# Patient Record
Sex: Female | Born: 1935 | Race: White | Hispanic: No | Smoking: Former smoker
Health system: Southern US, Community
[De-identification: ages and names within clinical notes are randomized; demographics above are authoritative.]

## PROBLEM LIST (undated history)

## (undated) DIAGNOSIS — E559 Vitamin D deficiency, unspecified: Secondary | ICD-10-CM

## (undated) DIAGNOSIS — M549 Dorsalgia, unspecified: Secondary | ICD-10-CM

## (undated) DIAGNOSIS — M48061 Spinal stenosis, lumbar region without neurogenic claudication: Secondary | ICD-10-CM

## (undated) DIAGNOSIS — G8929 Other chronic pain: Secondary | ICD-10-CM

## (undated) DIAGNOSIS — F419 Anxiety disorder, unspecified: Secondary | ICD-10-CM

## (undated) DIAGNOSIS — C50919 Malignant neoplasm of unspecified site of unspecified female breast: Secondary | ICD-10-CM

## (undated) DIAGNOSIS — H9190 Unspecified hearing loss, unspecified ear: Secondary | ICD-10-CM

## (undated) DIAGNOSIS — I10 Essential (primary) hypertension: Secondary | ICD-10-CM

## (undated) HISTORY — DX: Dorsalgia, unspecified: M54.9

## (undated) HISTORY — DX: Malignant neoplasm of unspecified site of unspecified female breast: C50.919

## (undated) HISTORY — DX: Anxiety disorder, unspecified: F41.9

## (undated) HISTORY — DX: Vitamin D deficiency, unspecified: E55.9

## (undated) HISTORY — DX: Essential (primary) hypertension: I10

## (undated) HISTORY — DX: Spinal stenosis, lumbar region without neurogenic claudication: M48.061

## (undated) HISTORY — DX: Other chronic pain: G89.29

## (undated) HISTORY — DX: Unspecified hearing loss, unspecified ear: H91.90

---

## 1985-05-12 DIAGNOSIS — C50919 Malignant neoplasm of unspecified site of unspecified female breast: Secondary | ICD-10-CM

## 1985-05-12 HISTORY — DX: Malignant neoplasm of unspecified site of unspecified female breast: C50.919

## 1985-05-12 HISTORY — PX: MASTECTOMY: SHX3

## 2004-03-07 ENCOUNTER — Encounter: Admission: RE | Admit: 2004-03-07 | Discharge: 2004-03-07 | Payer: Self-pay | Admitting: Orthopedic Surgery

## 2004-03-28 ENCOUNTER — Encounter: Admission: RE | Admit: 2004-03-28 | Discharge: 2004-03-28 | Payer: Self-pay | Admitting: Orthopedic Surgery

## 2004-04-11 ENCOUNTER — Encounter: Admission: RE | Admit: 2004-04-11 | Discharge: 2004-04-11 | Payer: Self-pay | Admitting: Orthopedic Surgery

## 2004-06-05 ENCOUNTER — Encounter: Admission: RE | Admit: 2004-06-05 | Discharge: 2004-06-05 | Payer: Self-pay | Admitting: Orthopedic Surgery

## 2004-07-29 ENCOUNTER — Ambulatory Visit: Payer: Self-pay | Admitting: Family Medicine

## 2004-08-16 ENCOUNTER — Ambulatory Visit: Payer: Self-pay | Admitting: Family Medicine

## 2004-08-26 ENCOUNTER — Encounter: Admission: RE | Admit: 2004-08-26 | Discharge: 2004-09-04 | Payer: Self-pay | Admitting: Family Medicine

## 2004-08-30 ENCOUNTER — Ambulatory Visit: Payer: Self-pay | Admitting: Family Medicine

## 2004-09-17 ENCOUNTER — Ambulatory Visit: Payer: Self-pay | Admitting: Family Medicine

## 2004-10-01 ENCOUNTER — Ambulatory Visit: Payer: Self-pay | Admitting: Family Medicine

## 2004-10-28 ENCOUNTER — Ambulatory Visit: Payer: Self-pay | Admitting: Family Medicine

## 2004-11-14 ENCOUNTER — Ambulatory Visit: Payer: Self-pay | Admitting: Family Medicine

## 2004-12-18 ENCOUNTER — Ambulatory Visit: Payer: Self-pay | Admitting: Family Medicine

## 2004-12-26 ENCOUNTER — Ambulatory Visit: Payer: Self-pay | Admitting: Family Medicine

## 2005-01-21 ENCOUNTER — Other Ambulatory Visit: Admission: RE | Admit: 2005-01-21 | Discharge: 2005-01-21 | Payer: Self-pay | Admitting: Family Medicine

## 2006-06-08 ENCOUNTER — Ambulatory Visit (HOSPITAL_COMMUNITY): Admission: RE | Admit: 2006-06-08 | Discharge: 2006-06-08 | Payer: Self-pay | Admitting: Internal Medicine

## 2010-06-01 ENCOUNTER — Encounter: Payer: Self-pay | Admitting: Orthopedic Surgery

## 2010-06-02 ENCOUNTER — Encounter (HOSPITAL_COMMUNITY): Payer: Self-pay | Admitting: Internal Medicine

## 2012-07-23 ENCOUNTER — Other Ambulatory Visit: Payer: Self-pay | Admitting: *Deleted

## 2012-07-23 DIAGNOSIS — M899 Disorder of bone, unspecified: Secondary | ICD-10-CM

## 2012-07-30 ENCOUNTER — Other Ambulatory Visit: Payer: Self-pay | Admitting: Physician Assistant

## 2012-07-30 NOTE — Telephone Encounter (Signed)
She was calling to check on her mother's prescription. The patient thought that we had called her but she has a slight hearing problem so she could be confused. Patient's daughter state's that a refill request was sent over on Monday but they have not heard anything yet.

## 2012-08-02 ENCOUNTER — Other Ambulatory Visit: Payer: Self-pay | Admitting: *Deleted

## 2012-08-02 ENCOUNTER — Other Ambulatory Visit: Payer: Self-pay | Admitting: Physician Assistant

## 2012-08-02 DIAGNOSIS — M549 Dorsalgia, unspecified: Secondary | ICD-10-CM

## 2012-08-02 MED ORDER — HYDROCODONE-ACETAMINOPHEN 5-325 MG PO TABS: 1.0000 | ORAL_TABLET | Freq: Two times a day (BID) | ORAL | Status: DC

## 2012-08-02 NOTE — Telephone Encounter (Signed)
rx printed; Vicki Gomez instructed to call pt. To p/u

## 2012-08-02 NOTE — Telephone Encounter (Signed)
Caledl to Ms Kintzel to inform her , RX for pain med ready and also left a voice mail on her daughter's cell number with same information

## 2012-08-02 NOTE — Telephone Encounter (Signed)
Patient's daughter requesting refill of Hydrocodone.  Spoke with pharmacist and last filled on 06/29/12.

## 2012-08-23 ENCOUNTER — Other Ambulatory Visit: Payer: Self-pay

## 2012-08-23 DIAGNOSIS — M549 Dorsalgia, unspecified: Secondary | ICD-10-CM

## 2012-08-23 MED ORDER — HYDROCODONE-ACETAMINOPHEN 5-325 MG PO TABS: 1.0000 | ORAL_TABLET | Freq: Two times a day (BID) | ORAL | Status: DC

## 2012-08-23 NOTE — Telephone Encounter (Signed)
Last written 08-02-12   Last seen 06/29/12    Print rx and have nurse call patient to pick up

## 2012-08-23 NOTE — Telephone Encounter (Signed)
Pt notified RX ready for pick up RX to front 

## 2012-08-25 ENCOUNTER — Other Ambulatory Visit: Payer: Self-pay

## 2012-08-25 ENCOUNTER — Ambulatory Visit: Payer: Self-pay

## 2012-09-17 ENCOUNTER — Other Ambulatory Visit: Payer: Self-pay | Admitting: Family Medicine

## 2012-09-20 NOTE — Telephone Encounter (Signed)
Last seen 06/29/12  ACm  Toprol not on patients med list

## 2012-09-23 ENCOUNTER — Encounter: Payer: Self-pay | Admitting: Family Medicine

## 2012-09-23 ENCOUNTER — Ambulatory Visit (INDEPENDENT_AMBULATORY_CARE_PROVIDER_SITE_OTHER): Payer: Medicare PPO | Admitting: Family Medicine

## 2012-09-23 VITALS — BP 165/95 | HR 82 | Temp 97.9°F | Wt 105.4 lb

## 2012-09-23 DIAGNOSIS — E785 Hyperlipidemia, unspecified: Secondary | ICD-10-CM

## 2012-09-23 DIAGNOSIS — M549 Dorsalgia, unspecified: Secondary | ICD-10-CM

## 2012-09-23 DIAGNOSIS — I10 Essential (primary) hypertension: Secondary | ICD-10-CM

## 2012-09-23 DIAGNOSIS — R7309 Other abnormal glucose: Secondary | ICD-10-CM

## 2012-09-23 DIAGNOSIS — R7303 Prediabetes: Secondary | ICD-10-CM

## 2012-09-23 LAB — BASIC METABOLIC PANEL WITH GFR
BUN: 17 mg/dL (ref 6–23)
CO2: 27 mEq/L (ref 19–32)
Calcium: 10.6 mg/dL — ABNORMAL HIGH (ref 8.4–10.5)
Chloride: 102 mEq/L (ref 96–112)
Creat: 0.84 mg/dL (ref 0.50–1.10)
GFR, Est African American: 74 mL/min
GFR, Est Non African American: 64 mL/min
Glucose, Bld: 92 mg/dL (ref 70–99)
Potassium: 4.5 mEq/L (ref 3.5–5.3)
Sodium: 140 mEq/L (ref 135–145)

## 2012-09-23 LAB — HEPATIC FUNCTION PANEL
ALT: 11 U/L (ref 0–35)
AST: 16 U/L (ref 0–37)
Albumin: 4.7 g/dL (ref 3.5–5.2)
Alkaline Phosphatase: 99 U/L (ref 39–117)
Bilirubin, Direct: 0.1 mg/dL (ref 0.0–0.3)
Indirect Bilirubin: 0.6 mg/dL (ref 0.0–0.9)
Total Bilirubin: 0.7 mg/dL (ref 0.3–1.2)
Total Protein: 7.6 g/dL (ref 6.0–8.3)

## 2012-09-23 LAB — POCT GLYCOSYLATED HEMOGLOBIN (HGB A1C): Hemoglobin A1C: 6

## 2012-09-23 LAB — TSH: TSH: 1.132 u[IU]/mL (ref 0.350–4.500)

## 2012-09-23 MED ORDER — HYDROCODONE-ACETAMINOPHEN 5-325 MG PO TABS: 1.0000 | ORAL_TABLET | Freq: Two times a day (BID) | ORAL | Status: DC

## 2012-09-23 MED ORDER — LISINOPRIL-HYDROCHLOROTHIAZIDE 20-12.5 MG PO TABS: 2.0000 | ORAL_TABLET | Freq: Every day | ORAL | Status: DC

## 2012-09-23 NOTE — Progress Notes (Signed)
Patient ID: Vicki Gomez, female   DOB: [birth year], 79 y.o.   MRN: [id number] SUBJECTIVE: HPI: .Patient is here for follow up of hypertension:  denies Headache;deniesChest Pain;denies weakness;denies Shortness of Breath or Orthopnea;denies Visual changes;denies palpitations;denies cough;denies pedal edema;denies symptoms of TIA or stroke; admits to Compliance with medications. denies Problems with medications. Brought her BPs. They are all normal 120s/70s at home.  PMH/PSH: reviewed/updated in Epic  SH/FH: reviewed/updated in Epic  Allergies: reviewed/updated in Epic  Medications: reviewed/updated in Epic  Immunizations: reviewed/updated in Epic  ROS: As above in the HPI. All other systems are stable or negative.  OBJECTIVE: APPEARANCE:  Patient in no acute distress.The patient appeared well nourished and normally developed. Acyanotic. Waist: VITAL SIGNS:BP 165/95  Pulse 82  Temp(Src) 97.9 F (36.6 C) (Oral)  Wt 105 lb 6.4 oz (47.809 kg) WF  SKIN: warm and  Dry without overt rashes, tattoos and scars  HEAD and Neck: without JVD, Head and scalp: normal Eyes:No scleral icterus. Fundi normal, eye movements normal. Ears: Auricle normal, canal normal, Tympanic membranes normal, insufflation normal. Nose: normal Throat: normal Neck & thyroid: normal  CHEST & LUNGS: Chest wall: normal Lungs: Clear  CVS: Reveals the PMI to be normally located. Regular rhythm, First and Second Heart sounds are normal,  absence of murmurs, rubs or gallops. Peripheral vasculature: Radial pulses: normal Dorsal pedis pulses: normal Posterior pulses: normal  ABDOMEN:  Appearance: normal Benign,, no organomegaly, no masses, no Abdominal Aortic enlargement. No Guarding , no rebound. No Bruits. Bowel sounds: normal  RECTAL: N/A GU: N/A  EXTREMETIES: nonedematous. Both Femoral and Pedal pulses are normal.  MUSCULOSKELETAL:  Spine: reduced ROM with pain on FROM Joints:  intact  NEUROLOGIC: oriented to time,place and person; nonfocal.  ASSESSMENT: Back pain - Plan: HYDROcodone-acetaminophen (NORCO/VICODIN) 5-325 MG per tablet  HTN (hypertension) - Plan: BASIC METABOLIC PANEL WITH GFR, TSH, lisinopril-hydrochlorothiazide (ZESTORETIC) 20-12.5 MG per tablet  Prediabetes - Plan: BASIC METABOLIC PANEL WITH GFR, POCT glycosylated hemoglobin (Hb A1C)  HLD (hyperlipidemia) - Plan: Hepatic function panel, NMR Lipoprofile with Lipids  PLAN:  Orders Placed This Encounter  Procedures  . BASIC METABOLIC PANEL WITH GFR  . Hepatic function panel  . NMR Lipoprofile with Lipids  . TSH  . POCT glycosylated hemoglobin (Hb A1C)   Results for orders placed in visit on 09/23/12  POCT GLYCOSYLATED HEMOGLOBIN (HGB A1C)      Result Value Range   Hemoglobin A1C 6.0     Meds ordered this encounter  Medications  . DISCONTD: lisinopril-hydrochlorothiazide (PRINZIDE,ZESTORETIC) 20-25 MG per tablet    Sig:   . LORazepam (ATIVAN) 0.5 MG tablet    Sig:   . potassium chloride (K-DUR,KLOR-CON) 10 MEQ tablet    Sig:   . HYDROcodone-acetaminophen (NORCO/VICODIN) 5-325 MG per tablet    Sig: Take 1 tablet by mouth 2 (two) times daily. As needed    Dispense:  60 tablet    Refill:  0    Order Specific Question:  Supervising Provider    Answer:  Ernestina Penna [[id number]]  . lisinopril-hydrochlorothiazide (ZESTORETIC) 20-12.5 MG per tablet    Sig: Take 2 tablets by mouth daily.    Dispense:  60 tablet    Refill:  3   RTc 3 months  Aiman Sonn P. Modesto Charon, M.D.

## 2012-09-27 LAB — NMR LIPOPROFILE WITH LIPIDS
Cholesterol, Total: 276 mg/dL — ABNORMAL HIGH (ref <200)
HDL Particle Number: 44 umol/L (ref >=30.5)
HDL Size: 9.9 nm (ref >=9.2)
HDL-C: 91 mg/dL (ref >=40)
LDL (calc): 172 mg/dL — ABNORMAL HIGH (ref <100)
LDL Particle Number: 1627 nmol/L — ABNORMAL HIGH (ref <1000)
LDL Size: 21.3 nm (ref >20.5)
LP-IR Score: <25 (ref <=45)
Large HDL-P: 15 umol/L (ref >=4.8)
Large VLDL-P: <0.8 nmol/L (ref <=2.7)
Small LDL Particle Number: 384 nmol/L (ref <=527)
Triglycerides: 63 mg/dL (ref <150)
VLDL Size: 34.7 nm (ref <=46.6)

## 2012-09-30 ENCOUNTER — Encounter: Payer: Self-pay | Admitting: *Deleted

## 2012-10-05 ENCOUNTER — Ambulatory Visit: Payer: Self-pay | Admitting: Family Medicine

## 2012-10-14 ENCOUNTER — Encounter: Payer: Self-pay | Admitting: *Deleted

## 2012-10-26 ENCOUNTER — Telehealth: Payer: Self-pay | Admitting: Family Medicine

## 2012-10-27 ENCOUNTER — Other Ambulatory Visit: Payer: Self-pay

## 2012-10-27 ENCOUNTER — Ambulatory Visit: Payer: Self-pay

## 2012-10-28 NOTE — Telephone Encounter (Signed)
Last filled and seen 09/23/12   Print Rx and have nurse call patient to pick up

## 2012-10-29 ENCOUNTER — Other Ambulatory Visit: Payer: Self-pay

## 2012-10-29 ENCOUNTER — Other Ambulatory Visit: Payer: Self-pay | Admitting: Family Medicine

## 2012-10-29 DIAGNOSIS — M549 Dorsalgia, unspecified: Secondary | ICD-10-CM

## 2012-10-29 NOTE — Telephone Encounter (Signed)
Last filled and last seen 09/23/12  Print and have nurse call patient to pick up

## 2012-11-01 NOTE — Telephone Encounter (Signed)
PT SEES DR. Modesto Charon AND HE IS OUT OF OFFICE. NOBODY ELSE SEES PT. CAN YOU PLEASE REVIEW AGAIN. THANKS.

## 2012-11-01 NOTE — Telephone Encounter (Signed)
PLEASE CALL IN

## 2012-11-01 NOTE — Telephone Encounter (Signed)
May refill x1 

## 2012-11-01 NOTE — Telephone Encounter (Signed)
[facility] 

## 2012-11-01 NOTE — Telephone Encounter (Signed)
Done 11/01/12

## 2012-11-04 ENCOUNTER — Telehealth: Payer: Self-pay | Admitting: Family Medicine

## 2012-12-15 ENCOUNTER — Ambulatory Visit: Payer: Medicare PPO

## 2012-12-24 ENCOUNTER — Other Ambulatory Visit: Payer: Self-pay | Admitting: Family Medicine

## 2012-12-27 NOTE — Telephone Encounter (Signed)
Last seen and last filled 09/23/12  FPW   If approved print and route to nurse

## 2012-12-27 NOTE — Telephone Encounter (Signed)
What is it to refill?????

## 2012-12-27 NOTE — Telephone Encounter (Signed)
[place]ong to address 

## 2012-12-30 ENCOUNTER — Other Ambulatory Visit: Payer: Self-pay | Admitting: Family Medicine

## 2012-12-30 NOTE — Telephone Encounter (Signed)
Misty Stanley patients daughter cell phone called and left message for her that rx was up left up front for her to pick up

## 2012-12-30 NOTE — Telephone Encounter (Signed)
Last seen 09/23/12  FPW and last filled 09/23/12  If approved print and route to nurse

## 2012-12-30 NOTE — Telephone Encounter (Signed)
Rx ready for pick up. 

## 2013-01-22 ENCOUNTER — Other Ambulatory Visit: Payer: Self-pay | Admitting: Physician Assistant

## 2013-01-24 ENCOUNTER — Other Ambulatory Visit: Payer: Self-pay | Admitting: Nurse Practitioner

## 2013-01-24 NOTE — Telephone Encounter (Signed)
Last seen 09/23/12  FPW  IF approved route to nurse to call into [facility]

## 2013-01-24 NOTE — Telephone Encounter (Signed)
Rx ready for Phone in. Reduced quantity. Pass due for follow up. And did not see Tammy or Marcelino Duster.

## 2013-01-25 NOTE — Telephone Encounter (Signed)
Called in.

## 2013-02-03 ENCOUNTER — Other Ambulatory Visit: Payer: Self-pay | Admitting: Family Medicine

## 2013-02-04 NOTE — Telephone Encounter (Signed)
Last seen 09/23/12  FPW   Last filled 12/24/12  If approved print and route to nurse

## 2013-02-07 NOTE — Telephone Encounter (Deleted)
Left message on pt voice mail that rx for hydrocodone ready for pick up

## 2013-02-07 NOTE — Telephone Encounter (Signed)
Rx ready for pick up. 

## 2013-02-07 NOTE — Telephone Encounter (Signed)
Pt aware to pick up rx for hydrocodone and to pick up at front office.

## 2013-02-14 ENCOUNTER — Ambulatory Visit (INDEPENDENT_AMBULATORY_CARE_PROVIDER_SITE_OTHER): Payer: Medicare PPO

## 2013-02-14 DIAGNOSIS — Z23 Encounter for immunization: Secondary | ICD-10-CM

## 2013-03-07 ENCOUNTER — Telehealth: Payer: Self-pay | Admitting: Family Medicine

## 2013-03-12 NOTE — Telephone Encounter (Signed)
Need to see her for an exam and documentation, because in EPIC there is no Dx of dementia. And will need a MMSE.

## 2013-03-14 NOTE — Telephone Encounter (Signed)
Spoke with Misty Stanley and is aware that needs appt and will call later. States she needs papers today

## 2013-03-18 ENCOUNTER — Other Ambulatory Visit: Payer: Self-pay | Admitting: *Deleted

## 2013-03-18 NOTE — Telephone Encounter (Signed)
Last filled 02/03/13, last seen 09/23/12. Call her brother at [phone] to pick up when ready. Route to pool A

## 2013-03-18 NOTE — Telephone Encounter (Signed)
Patient needs to be seen. Patient has exceeded limit since last visit. Refill denied. Bring all medications at next office visit. 

## 2013-03-24 ENCOUNTER — Ambulatory Visit (INDEPENDENT_AMBULATORY_CARE_PROVIDER_SITE_OTHER): Payer: Medicare PPO | Admitting: Nurse Practitioner

## 2013-03-24 ENCOUNTER — Telehealth: Payer: Self-pay | Admitting: Family Medicine

## 2013-03-24 ENCOUNTER — Encounter: Payer: Self-pay | Admitting: Nurse Practitioner

## 2013-03-24 VITALS — BP 185/73 | HR 70 | Temp 97.9°F | Ht 61.0 in | Wt 102.0 lb

## 2013-03-24 DIAGNOSIS — G8929 Other chronic pain: Secondary | ICD-10-CM | POA: Insufficient documentation

## 2013-03-24 DIAGNOSIS — M545 Low back pain, unspecified: Secondary | ICD-10-CM | POA: Insufficient documentation

## 2013-03-24 DIAGNOSIS — I10 Essential (primary) hypertension: Secondary | ICD-10-CM | POA: Insufficient documentation

## 2013-03-24 DIAGNOSIS — F411 Generalized anxiety disorder: Secondary | ICD-10-CM | POA: Insufficient documentation

## 2013-03-24 DIAGNOSIS — E876 Hypokalemia: Secondary | ICD-10-CM | POA: Insufficient documentation

## 2013-03-24 LAB — POCT CBC
Granulocyte percent: 58.2 %G (ref 37–80)
HCT, POC: 44.3 % (ref 37.7–47.9)
Hemoglobin: 14.3 g/dL (ref 12.2–16.2)
Lymph, poc: 2.5 (ref 0.6–3.4)
MCH, POC: 29.4 pg (ref 27–31.2)
MCHC: 32.2 g/dL (ref 31.8–35.4)
MCV: 91.3 fL (ref 80–97)
MPV: 8.2 fL (ref 0–99.8)
POC Granulocyte: 4.2 (ref 2–6.9)
POC LYMPH PERCENT: 34.5 %L (ref 10–50)
Platelet Count, POC: 262 10*3/uL (ref 142–424)
RBC: 4.9 M/uL (ref 4.04–5.48)
RDW, POC: 14.4 %
WBC: 7.2 10*3/uL (ref 4.6–10.2)

## 2013-03-24 MED ORDER — HYDROCODONE-ACETAMINOPHEN 7.5-325 MG/15ML PO SOLN: 15.0000 mL | Freq: Two times a day (BID) | ORAL | Status: DC

## 2013-03-24 MED ORDER — HYDROCODONE-ACETAMINOPHEN 7.5-325 MG PO TABS: 1.0000 | ORAL_TABLET | Freq: Four times a day (QID) | ORAL | Status: DC | PRN

## 2013-03-24 NOTE — Progress Notes (Signed)
Subjective:    Patient ID: Vicki Gomez, female    DOB: 13-Sep-1926, 79 y.o.   MRN: [id number]  HPI  Patient in c/o low back pain- needs pain meds filled. SHe has not been seen in awhile so really needs follow up of chronic medical problems- Family says she is doing well other than her constant back pain. Patient Active Problem List   Diagnosis Date Noted  . Hypertension 03/24/2013  . GAD (generalized anxiety disorder) 03/24/2013  . Hypokalemia 03/24/2013  . Chronic low back pain 03/24/2013   Outpatient Encounter Prescriptions as of 03/24/2013  Medication Sig  . HYDROcodone-acetaminophen (NORCO/VICODIN) 5-325 MG per tablet TAKE 1 TABLET TWICE A DAY AS NEEDED  . lisinopril-hydrochlorothiazide (ZESTORETIC) 20-12.5 MG per tablet Take 2 tablets by mouth daily.  Marland Kitchen LORazepam (ATIVAN) 0.5 MG tablet TAKE 1 TABLET BY MOUTH EVERY 12 TO 24 HOURS AS NEEDED  . metoprolol succinate (TOPROL-XL) 25 MG 24 hr tablet TAKE 1 TABLET BY MOUTH DAILY  . potassium chloride (K-DUR,KLOR-CON) 10 MEQ tablet   . [DISCONTINUED] potassium chloride (MICRO-K) 10 MEQ CR capsule TAKE ONE CAPSULE DAILY       Review of Systems  Constitutional: Negative.   HENT: Negative.   Respiratory: Negative.  Negative for cough, chest tightness, shortness of breath and wheezing.   Cardiovascular: Negative for chest pain and leg swelling.  Musculoskeletal: Positive for back pain.  Neurological: Negative.   Psychiatric/Behavioral: Negative.        Objective:   Physical Exam  Constitutional: She is oriented to person, place, and time. She appears well-developed and well-nourished.  HENT:  Nose: Nose normal.  Mouth/Throat: Oropharynx is clear and moist.  Eyes: EOM are normal.  Neck: Trachea normal, normal range of motion and full passive range of motion without pain. Neck supple. No JVD present. Carotid bruit is not present. No thyromegaly present.  Cardiovascular: Normal rate, regular rhythm, normal heart sounds and intact  distal pulses.  Exam reveals no gallop and no friction rub.   No murmur heard. Pulmonary/Chest: Effort normal and breath sounds normal.  Abdominal: Soft. Bowel sounds are normal. She exhibits no distension and no mass. There is no tenderness.  Musculoskeletal: Normal range of motion.  Pian with the slightest movement of back (+)SLR bil at 15 degrees DTR's equal bil  Lymphadenopathy:    She has no cervical adenopathy.  Neurological: She is alert and oriented to person, place, and time. She has normal reflexes.  Skin: Skin is warm and dry.  Psychiatric: She has a normal mood and affect. Her behavior is normal. Judgment and thought content normal.    BP 185/73  Pulse 70  Temp(Src) 97.9 F (36.6 C) (Oral)  Ht 5\' 1"  (1.549 m)  Wt 102 lb (46.267 kg)  BMI 19.28 kg/m2       Assessment & Plan:   1. Hypertension   2. GAD (generalized anxiety disorder)   3. Hypokalemia   4. Chronic low back pain    Orders Placed This Encounter  Procedures  . CMP14+EGFR  . POCT CBC   Meds ordered this encounter  Medications  . HYDROcodone-acetaminophen (HYCET) 7.5-325 mg/15 ml solution    Sig: Take 15 mLs by mouth 2 (two) times daily.    Dispense:  60 mL    Refill:  0    Order Specific Question:  Supervising Provider    Answer:  Ernestina Penna [[id number]]    Continue all meds Labs pending Diet and exercise encouraged Health maintenance reviewed Follow up  in 3 months  Mary-Margaret Daphine Deutscher, FNP

## 2013-03-24 NOTE — Patient Instructions (Signed)

## 2013-03-25 LAB — CMP14+EGFR
ALT: 12 IU/L (ref 0–32)
AST: 19 IU/L (ref 0–40)
Albumin/Globulin Ratio: 1.7 (ref 1.1–2.5)
Albumin: 4.4 g/dL (ref 3.5–4.7)
Alkaline Phosphatase: 92 IU/L (ref 39–117)
BUN/Creatinine Ratio: 19 (ref 11–26)
BUN: 17 mg/dL (ref 8–27)
CO2: 30 mmol/L — ABNORMAL HIGH (ref 18–29)
Calcium: 10 mg/dL (ref 8.6–10.2)
Chloride: 100 mmol/L (ref 97–108)
Creatinine, Ser: 0.88 mg/dL (ref 0.57–1.00)
GFR calc Af Amer: 70 mL/min/{1.73_m2} (ref >59)
GFR calc non Af Amer: 61 mL/min/{1.73_m2} (ref >59)
Globulin, Total: 2.6 g/dL (ref 1.5–4.5)
Glucose: 96 mg/dL (ref 65–99)
Potassium: 4.5 mmol/L (ref 3.5–5.2)
Sodium: 143 mmol/L (ref 134–144)
Total Bilirubin: 0.5 mg/dL (ref 0.0–1.2)
Total Protein: 7 g/dL (ref 6.0–8.5)

## 2013-05-10 ENCOUNTER — Other Ambulatory Visit: Payer: Self-pay | Admitting: Family Medicine

## 2013-05-10 ENCOUNTER — Telehealth: Payer: Self-pay | Admitting: Family Medicine

## 2013-05-10 MED ORDER — HYDROCODONE-ACETAMINOPHEN 7.5-325 MG PO TABS: 1.0000 | ORAL_TABLET | Freq: Four times a day (QID) | ORAL | Status: DC | PRN

## 2013-05-10 NOTE — Telephone Encounter (Signed)
Rx ready for pick up. 

## 2013-05-10 NOTE — Telephone Encounter (Signed)
Up front 

## 2013-05-18 ENCOUNTER — Other Ambulatory Visit: Payer: Self-pay | Admitting: Family Medicine

## 2013-05-20 ENCOUNTER — Other Ambulatory Visit: Payer: Self-pay | Admitting: Family Medicine

## 2013-05-20 NOTE — Telephone Encounter (Signed)
Patient last seen in office on 03-24-13. Please advise

## 2013-05-20 NOTE — Telephone Encounter (Signed)
Please call in ativan rx

## 2013-05-23 NOTE — Telephone Encounter (Signed)
Called in.

## 2013-06-24 ENCOUNTER — Ambulatory Visit: Payer: Medicare PPO | Admitting: Family Medicine

## 2013-07-01 ENCOUNTER — Ambulatory Visit (INDEPENDENT_AMBULATORY_CARE_PROVIDER_SITE_OTHER): Payer: Medicare PPO | Admitting: Family Medicine

## 2013-07-01 ENCOUNTER — Encounter: Payer: Self-pay | Admitting: Family Medicine

## 2013-07-01 VITALS — BP 180/78 | HR 72 | Temp 98.3°F | Ht 61.0 in | Wt 105.0 lb

## 2013-07-01 DIAGNOSIS — F411 Generalized anxiety disorder: Secondary | ICD-10-CM

## 2013-07-01 DIAGNOSIS — M545 Low back pain, unspecified: Secondary | ICD-10-CM

## 2013-07-01 DIAGNOSIS — G8929 Other chronic pain: Secondary | ICD-10-CM

## 2013-07-01 DIAGNOSIS — H9190 Unspecified hearing loss, unspecified ear: Secondary | ICD-10-CM | POA: Insufficient documentation

## 2013-07-01 DIAGNOSIS — M48061 Spinal stenosis, lumbar region without neurogenic claudication: Secondary | ICD-10-CM | POA: Insufficient documentation

## 2013-07-01 DIAGNOSIS — E876 Hypokalemia: Secondary | ICD-10-CM

## 2013-07-01 DIAGNOSIS — I10 Essential (primary) hypertension: Secondary | ICD-10-CM

## 2013-07-01 MED ORDER — LORAZEPAM 0.5 MG PO TABS: ORAL_TABLET | ORAL | Status: DC

## 2013-07-01 MED ORDER — HYDROCODONE-ACETAMINOPHEN 7.5-325 MG PO TABS: 1.0000 | ORAL_TABLET | Freq: Four times a day (QID) | ORAL | Status: DC | PRN

## 2013-07-01 NOTE — Progress Notes (Signed)
Patient ID: Vicki Gomez, female   DOB: 10/26/1926, 80 y.o.   MRN: [id number] SUBJECTIVE: CC: Chief Complaint  Patient presents with  . Medication Refill    wants pain pill refilled and lorazepam wantsa to know if has to take the potassium    HPI: Patient is here for follow up of hypertension: denies Headache;deniesChest Pain;denies weakness;denies Shortness of Breath or Orthopnea;denies Visual changes;denies palpitations;denies cough;denies pedal edema;denies symptoms of TIA or stroke; admits to Compliance with medications. denies Problems with medications.  Chronic back pain from spinal stenosis: needs pain pills refilled. In excruciating back pain. No change in quality of her back pain from spinal stenosis.  HOH: needs hearing aids but too expensive for her.  In pain in her back., because she has ran out of pain pills and her BP went up because of pain.  Past Medical History  Diagnosis Date  . Hard of hearing   . Hypertension   . Anxiety   . Chronic back pain   . Spinal stenosis of lumbar region    No past surgical history on file. History   Social History  . Marital Status: Widowed    Spouse Name: N/A    Number of Children: N/A  . Years of Education: N/A   Occupational History  . Not on file.   Social History Main Topics  . Smoking status: Former Research scientist (life sciences)  . Smokeless tobacco: Not on file  . Alcohol Use: No  . Drug Use: No  . Sexual Activity: Not on file   Other Topics Concern  . Not on file   Social History Narrative  . No narrative on file   No family history on file. Current Outpatient Prescriptions on File Prior to Visit  Medication Sig Dispense Refill  . lisinopril-hydrochlorothiazide (ZESTORETIC) 20-12.5 MG per tablet Take 2 tablets by mouth daily.  60 tablet  3  . metoprolol succinate (TOPROL-XL) 25 MG 24 hr tablet TAKE 1 TABLET BY MOUTH DAILY  30 tablet  1  . potassium chloride (K-DUR,KLOR-CON) 10 MEQ tablet        No current facility-administered  medications on file prior to visit.   Allergies  Allergen Reactions  . Codeine   . Pneumovax [Pneumococcal Polysaccharides]    Immunization History  Administered Date(s) Administered  . Influenza,inj,Quad PF,36+ Mos 02/14/2013   Prior to Admission medications   Medication Sig Start Date End Date Taking? Authorizing Provider  HYDROcodone-acetaminophen (NORCO) 7.5-325 MG per tablet Take 1 tablet by mouth every 6 (six) hours as needed for moderate pain. 05/10/13   Vernie Shanks, MD  lisinopril-hydrochlorothiazide (ZESTORETIC) 20-12.5 MG per tablet Take 2 tablets by mouth daily. 09/23/12   Vernie Shanks, MD  LORazepam (ATIVAN) 0.5 MG tablet TAKE 1 TABLET EVERY 12 TO 24 HOURS AS NEEDED 05/18/13   Mary-Margaret Hassell Done, FNP  metoprolol succinate (TOPROL-XL) 25 MG 24 hr tablet TAKE 1 TABLET BY MOUTH DAILY 09/17/12   Mary-Margaret Hassell Done, FNP  potassium chloride (K-DUR,KLOR-CON) 10 MEQ tablet  09/01/12   Historical Provider, MD     ROS: As above in the HPI. All other systems are stable or negative.  OBJECTIVE: APPEARANCE:  Patient in no acute distress.The patient appeared well nourished and normally developed. Acyanotic. Waist: VITAL SIGNS:BP 180/78  Pulse 72  Temp(Src) 98.3 F (36.8 C) (Oral)  Ht $R'5\' 1"'HE$  (1.549 m)  Wt 105 lb (47.628 kg)  BMI 19.85 kg/m2 Frail elderly WF HOH=hard of hearing  Recheck BP=180/78  SKIN: warm and  Dry without overt  rashes, tattoos and scars  HEAD and Neck: without JVD, Head and scalp: normal Eyes:No scleral icterus. Fundi normal, eye movements normal. Ears: Auricle normal, canal normal, Tympanic membranes normal, insufflation normal. Nose: normal Throat: normal Neck & thyroid: normal  CHEST & LUNGS: Chest wall: normal Lungs: Clear  CVS: Reveals the PMI to be normally located. Regular rhythm, First and Second Heart sounds are normal,  absence of murmurs, rubs or gallops. Peripheral vasculature: Radial pulses: normal  ABDOMEN:  Appearance:  normal Benign, no organomegaly, no masses, no Abdominal Aortic enlargement. No Guarding , no rebound. No Bruits. Bowel sounds: normal  RECTAL: N/A GU: N/A  EXTREMETIES: nonedematous.  MUSCULOSKELETAL:  Spine: reduced ROM with severe pain. Joints: intact  NEUROLOGIC: oriented to place and person;  Results for orders placed in visit on 03/24/13  CMP14+EGFR      Result Value Ref Range   Glucose 96  65 - 99 mg/dL   BUN 17  8 - 27 mg/dL   Creatinine, Ser 0.88  0.57 - 1.00 mg/dL   GFR calc non Af Amer 61  >59 mL/min/1.73   GFR calc Af Amer 70  >59 mL/min/1.73   BUN/Creatinine Ratio 19  11 - 26   Sodium 143  134 - 144 mmol/L   Potassium 4.5  3.5 - 5.2 mmol/L   Chloride 100  97 - 108 mmol/L   CO2 30 (*) 18 - 29 mmol/L   Calcium 10.0  8.6 - 10.2 mg/dL   Total Protein 7.0  6.0 - 8.5 g/dL   Albumin 4.4  3.5 - 4.7 g/dL   Globulin, Total 2.6  1.5 - 4.5 g/dL   Albumin/Globulin Ratio 1.7  1.1 - 2.5   Total Bilirubin 0.5  0.0 - 1.2 mg/dL   Alkaline Phosphatase 92  39 - 117 IU/L   AST 19  0 - 40 IU/L   ALT 12  0 - 32 IU/L  POCT CBC      Result Value Ref Range   WBC 7.2  4.6 - 10.2 K/uL   Lymph, poc 2.5  0.6 - 3.4   POC LYMPH PERCENT 34.5  10 - 50 %L   POC Granulocyte 4.2  2 - 6.9   Granulocyte percent 58.2  37 - 80 %G   RBC 4.9  4.04 - 5.48 M/uL   Hemoglobin 14.3  12.2 - 16.2 g/dL   HCT, POC 44.3  37.7 - 47.9 %   MCV 91.3  80 - 97 fL   MCH, POC 29.4  27 - 31.2 pg   MCHC 32.2  31.8 - 35.4 g/dL   RDW, POC 14.4     Platelet Count, POC 262.0  142 - 424 K/uL   MPV 8.2  0 - 99.8 fL    ASSESSMENT: Chronic low back pain - Plan: HYDROcodone-acetaminophen (NORCO) 7.5-325 MG per tablet  Hypokalemia - Plan: BMP8+EGFR  Hypertension - Plan: BMP8+EGFR  Hard of hearing  Spinal stenosis of lumbar region - Plan: HYDROcodone-acetaminophen (NORCO) 7.5-325 MG per tablet  GAD (generalized anxiety disorder) - Plan: LORazepam (ATIVAN) 0.5 MG tablet  PLAN: Low salt diet.  Continue  present medications.   Orders Placed This Encounter  Procedures  . BMP8+EGFR   Meds ordered this encounter  Medications  . LORazepam (ATIVAN) 0.5 MG tablet    Sig: TAKE 1 TABLET EVERY 12 TO 24 HOURS AS NEEDED    Dispense:  30 tablet    Refill:  1  . HYDROcodone-acetaminophen (NORCO) 7.5-325 MG per tablet  Sig: Take 1 tablet by mouth every 6 (six) hours as needed for moderate pain.    Dispense:  60 tablet    Refill:  0    Order Specific Question:  Supervising Provider    Answer:  Chipper Herb [[id number]]   Medications Discontinued During This Encounter  Medication Reason  . LORazepam (ATIVAN) 0.5 MG tablet Reorder  . HYDROcodone-acetaminophen (NORCO) 7.5-325 MG per tablet Reorder   Return in about 2 weeks (around 07/15/2013) for recheck BP.  Bronsen Serano P. Jacelyn Grip, M.D.

## 2013-07-02 LAB — BMP8+EGFR
BUN/Creatinine Ratio: 16 (ref 11–26)
BUN: 14 mg/dL (ref 8–27)
CO2: 27 mmol/L (ref 18–29)
Calcium: 10 mg/dL (ref 8.7–10.3)
Chloride: 101 mmol/L (ref 97–108)
Creatinine, Ser: 0.87 mg/dL (ref 0.57–1.00)
GFR calc Af Amer: 71 mL/min/{1.73_m2} (ref >59)
GFR calc non Af Amer: 61 mL/min/{1.73_m2} (ref >59)
Glucose: 103 mg/dL — ABNORMAL HIGH (ref 65–99)
Potassium: 3.9 mmol/L (ref 3.5–5.2)
Sodium: 143 mmol/L (ref 134–144)

## 2013-07-29 ENCOUNTER — Other Ambulatory Visit: Payer: Self-pay | Admitting: Family Medicine

## 2013-07-29 ENCOUNTER — Telehealth: Payer: Self-pay | Admitting: Family Medicine

## 2013-07-29 DIAGNOSIS — M545 Low back pain, unspecified: Secondary | ICD-10-CM

## 2013-07-29 DIAGNOSIS — M48061 Spinal stenosis, lumbar region without neurogenic claudication: Secondary | ICD-10-CM

## 2013-07-29 DIAGNOSIS — G8929 Other chronic pain: Secondary | ICD-10-CM

## 2013-07-29 MED ORDER — HYDROCODONE-ACETAMINOPHEN 7.5-325 MG PO TABS: 1.0000 | ORAL_TABLET | Freq: Four times a day (QID) | ORAL | Status: DC | PRN

## 2013-07-29 NOTE — Telephone Encounter (Signed)
Pt notified rx ready for pick up.

## 2013-07-29 NOTE — Telephone Encounter (Signed)
Rx ready for pick up. 

## 2013-08-24 ENCOUNTER — Other Ambulatory Visit: Payer: Self-pay | Admitting: Family Medicine

## 2013-08-26 ENCOUNTER — Other Ambulatory Visit: Payer: Self-pay | Admitting: *Deleted

## 2013-08-26 DIAGNOSIS — I10 Essential (primary) hypertension: Secondary | ICD-10-CM

## 2013-08-26 MED ORDER — LISINOPRIL-HYDROCHLOROTHIAZIDE 20-12.5 MG PO TABS: 2.0000 | ORAL_TABLET | Freq: Every day | ORAL | Status: DC

## 2013-08-26 NOTE — Telephone Encounter (Signed)
Last seen 07/01/13, last filled 07/29/13. [facility]

## 2013-08-29 NOTE — Telephone Encounter (Signed)
Rx ready for nurse to Phone in. 

## 2013-08-29 NOTE — Telephone Encounter (Signed)
rx lorazeam called to [facility]

## 2013-08-30 ENCOUNTER — Telehealth: Payer: Self-pay | Admitting: Family Medicine

## 2013-09-05 ENCOUNTER — Other Ambulatory Visit: Payer: Self-pay | Admitting: Family Medicine

## 2013-09-05 DIAGNOSIS — M545 Low back pain, unspecified: Secondary | ICD-10-CM

## 2013-09-05 DIAGNOSIS — G8929 Other chronic pain: Secondary | ICD-10-CM

## 2013-09-05 DIAGNOSIS — M48061 Spinal stenosis, lumbar region without neurogenic claudication: Secondary | ICD-10-CM

## 2013-09-05 MED ORDER — LORAZEPAM 0.5 MG PO TABS: ORAL_TABLET | ORAL | Status: DC

## 2013-09-05 MED ORDER — HYDROCODONE-ACETAMINOPHEN 7.5-325 MG PO TABS: 1.0000 | ORAL_TABLET | Freq: Four times a day (QID) | ORAL | Status: DC | PRN

## 2013-09-05 NOTE — Telephone Encounter (Signed)
Patient aware.

## 2013-09-05 NOTE — Telephone Encounter (Signed)
Rx ready for pick up. 

## 2013-10-04 ENCOUNTER — Telehealth: Payer: Self-pay | Admitting: Family Medicine

## 2013-10-04 DIAGNOSIS — M545 Low back pain, unspecified: Secondary | ICD-10-CM

## 2013-10-04 DIAGNOSIS — M48061 Spinal stenosis, lumbar region without neurogenic claudication: Secondary | ICD-10-CM

## 2013-10-04 DIAGNOSIS — G8929 Other chronic pain: Secondary | ICD-10-CM

## 2013-10-05 MED ORDER — HYDROCODONE-ACETAMINOPHEN 7.5-325 MG PO TABS: 1.0000 | ORAL_TABLET | Freq: Four times a day (QID) | ORAL | Status: DC | PRN

## 2013-10-05 MED ORDER — LORAZEPAM 0.5 MG PO TABS: ORAL_TABLET | ORAL | Status: DC

## 2013-10-05 NOTE — Telephone Encounter (Signed)
rx ready for pickup 

## 2013-10-05 NOTE — Telephone Encounter (Signed)
She has appt with Bill O. On 10/11/13, but is completely out as of yesterday. Please print and call daughter at [phone] to pick up

## 2013-10-05 NOTE — Telephone Encounter (Signed)
Patient aware to come pick up 

## 2013-10-11 ENCOUNTER — Encounter: Payer: Self-pay | Admitting: Family Medicine

## 2013-10-11 ENCOUNTER — Ambulatory Visit (INDEPENDENT_AMBULATORY_CARE_PROVIDER_SITE_OTHER): Payer: Medicare PPO | Admitting: Family Medicine

## 2013-10-11 VITALS — BP 174/75 | HR 71 | Temp 98.1°F | Ht 61.0 in | Wt 105.6 lb

## 2013-10-11 DIAGNOSIS — G8929 Other chronic pain: Secondary | ICD-10-CM

## 2013-10-11 DIAGNOSIS — M545 Low back pain, unspecified: Secondary | ICD-10-CM

## 2013-10-11 DIAGNOSIS — M48061 Spinal stenosis, lumbar region without neurogenic claudication: Secondary | ICD-10-CM

## 2013-10-11 MED ORDER — CITALOPRAM HYDROBROMIDE 20 MG PO TABS: 20.0000 mg | ORAL_TABLET | Freq: Every day | ORAL | Status: DC

## 2013-10-11 MED ORDER — LORAZEPAM 0.5 MG PO TABS: ORAL_TABLET | ORAL | Status: DC

## 2013-10-11 MED ORDER — FENTANYL 25 MCG/HR TD PT72: 25.0000 ug | MEDICATED_PATCH | TRANSDERMAL | Status: DC

## 2013-10-11 MED ORDER — HYDROCODONE-ACETAMINOPHEN 7.5-325 MG PO TABS: 1.0000 | ORAL_TABLET | Freq: Four times a day (QID) | ORAL | Status: DC | PRN

## 2013-10-11 NOTE — Progress Notes (Signed)
   Subjective:    Patient ID: Vicki Gomez, female    DOB: [birth year], 80 y.o.   MRN: [id number]  HPI This 80 y.o. female presents for evaluation of refill on lorazepam and pain meds for chronic back pain. She is accompanied by her daughter who states she is not controlled well with the hydrocodone and thinks she needs some stronger pain medication because she is not sleeping well.  Her pain is on a severe scale now.  She has hx of DDD of the LS spine.  Review of Systems C/o back pain and GAD No chest pain, SOB, HA, dizziness, vision change, N/V, diarrhea, constipation, dysuria, urinary urgency or frequency, myalgias, arthralgias or rash.     Objective:   Physical Exam  Vital signs noted . Chronically ill appearing elderly female in pain.  HEENT - Head atraumatic Normocephalic                Eyes - PERRLA, Conjuctiva - clear Sclera- Clear EOMI                Ears - EAC's Wnl TM's Wnl Gross Hearing WNL Respiratory - Lungs CTA bilateral Cardiac - RRR S1 and S2 without murmur GI - Abdomen soft Nontender and bowel sounds active x 4 MS - TTP lumbar spine      Assessment & Plan:  Chronic low back pain - Plan: fentaNYL (DURAGESIC - DOSED MCG/HR) 25 MCG/HR patch, citalopram (CELEXA) 20 MG tablet, LORazepam (ATIVAN) 0.5 MG tablet, HYDROcodone-acetaminophen (NORCO) 7.5-325 MG per tablet  Spinal stenosis of lumbar region - Plan: fentaNYL (DURAGESIC - DOSED MCG/HR) 25 MCG/HR patch, citalopram (CELEXA) 20 MG tablet, LORazepam (ATIVAN) 0.5 MG tablet, HYDROcodone-acetaminophen (NORCO) 7.5-325 MG per tablet  Follow up prn and in one month  Lysbeth Penner FNP

## 2013-10-12 ENCOUNTER — Other Ambulatory Visit: Payer: Self-pay | Admitting: Nurse Practitioner

## 2013-10-14 ENCOUNTER — Other Ambulatory Visit: Payer: Self-pay | Admitting: Nurse Practitioner

## 2013-10-20 ENCOUNTER — Telehealth: Payer: Self-pay | Admitting: Family Medicine

## 2013-10-21 ENCOUNTER — Other Ambulatory Visit: Payer: Self-pay | Admitting: Family Medicine

## 2013-10-21 MED ORDER — FENTANYL 50 MCG/HR TD PT72: 50.0000 ug | MEDICATED_PATCH | TRANSDERMAL | Status: DC

## 2013-10-21 NOTE — Telephone Encounter (Signed)
Come in and p/u fentanyl rx

## 2013-10-24 NOTE — Telephone Encounter (Signed)
Rx up front patient aware .

## 2013-10-28 ENCOUNTER — Other Ambulatory Visit: Payer: Self-pay | Admitting: Nurse Practitioner

## 2013-11-04 ENCOUNTER — Encounter: Payer: Self-pay | Admitting: Family Medicine

## 2013-11-04 ENCOUNTER — Ambulatory Visit (INDEPENDENT_AMBULATORY_CARE_PROVIDER_SITE_OTHER): Payer: Medicare PPO | Admitting: Family Medicine

## 2013-11-04 VITALS — BP 180/77 | HR 93 | Temp 99.4°F | Ht 61.0 in | Wt 106.0 lb

## 2013-11-04 DIAGNOSIS — G8929 Other chronic pain: Secondary | ICD-10-CM

## 2013-11-04 DIAGNOSIS — M48061 Spinal stenosis, lumbar region without neurogenic claudication: Secondary | ICD-10-CM

## 2013-11-04 DIAGNOSIS — M545 Low back pain, unspecified: Secondary | ICD-10-CM

## 2013-11-04 MED ORDER — HYDROCODONE-ACETAMINOPHEN 7.5-325 MG PO TABS: 1.0000 | ORAL_TABLET | Freq: Four times a day (QID) | ORAL | Status: DC | PRN

## 2013-11-04 NOTE — Progress Notes (Signed)
   Subjective:    Patient ID: Vicki Gomez, female    DOB: 1926/07/07, 80 y.o.   MRN: [id number]  HPI  This 80 y.o. female presents for evaluation of back pain.  She is not able to tolerate the duragesic Patch and she wants to go back to her hydrocodone.  She has severe back pain.  She has been having some lower extremity edema.  Review of Systems C/o back pain and lower extremity edema.   No chest pain, SOB, HA, dizziness, vision change, N/V, diarrhea, constipation, dysuria, urinary urgency or frequency, myalgias, arthralgias or rash.  Objective:   Physical Exam Filed Vitals:   11/04/13 1006  BP: 180/77  Pulse: 93  Temp: 99.4 F (37.4 C)   Chronically ill appearing elderly female.  HEENT - Head atraumatic Normocephalic                Eyes - PERRLA, Conjuctiva - clear Sclera- Clear EOMI                Ears - EAC's Wnl TM's Wnl Gross Hearing WNL                Throat - oropharanx wnl Respiratory - Lungs CTA bilateral Cardiac - RRR S1 and S2 without murmur GI - Abdomen soft Nontender and bowel sounds active x 4 Extremities - one plus pedal edema Neuro - Grossly intact.      Assessment & Plan:  Chronic low back pain - Plan: HYDROcodone-acetaminophen (NORCO) 7.5-325 MG per tablet  Spinal stenosis of lumbar region - Plan: HYDROcodone-acetaminophen (NORCO) 7.5-325 MG per tablet  Edema - raise or elevate legs and wear compression stockings at home.  Lysbeth Penner FNP

## 2013-11-15 ENCOUNTER — Ambulatory Visit: Payer: Medicare PPO | Admitting: Family Medicine

## 2013-12-02 ENCOUNTER — Other Ambulatory Visit: Payer: Self-pay | Admitting: Family Medicine

## 2013-12-02 DIAGNOSIS — M48061 Spinal stenosis, lumbar region without neurogenic claudication: Secondary | ICD-10-CM

## 2013-12-02 DIAGNOSIS — G8929 Other chronic pain: Secondary | ICD-10-CM

## 2013-12-02 DIAGNOSIS — M545 Low back pain: Principal | ICD-10-CM

## 2013-12-02 MED ORDER — HYDROCODONE-ACETAMINOPHEN 7.5-325 MG PO TABS: 1.0000 | ORAL_TABLET | Freq: Four times a day (QID) | ORAL | Status: DC | PRN

## 2013-12-02 NOTE — Telephone Encounter (Signed)
Pt notified of rx ready for pick up Rx to the front for pick up

## 2013-12-02 NOTE — Telephone Encounter (Signed)
Last refilled on 11/04/13. If approved print and have nurse call to pickup.

## 2013-12-28 ENCOUNTER — Other Ambulatory Visit: Payer: Self-pay | Admitting: Nurse Practitioner

## 2014-01-02 ENCOUNTER — Other Ambulatory Visit: Payer: Self-pay | Admitting: Family Medicine

## 2014-01-02 DIAGNOSIS — G8929 Other chronic pain: Secondary | ICD-10-CM

## 2014-01-02 DIAGNOSIS — M545 Low back pain, unspecified: Secondary | ICD-10-CM

## 2014-01-02 DIAGNOSIS — M48061 Spinal stenosis, lumbar region without neurogenic claudication: Secondary | ICD-10-CM

## 2014-01-02 MED ORDER — HYDROCODONE-ACETAMINOPHEN 7.5-325 MG PO TABS: 1.0000 | ORAL_TABLET | Freq: Four times a day (QID) | ORAL | Status: DC | PRN

## 2014-01-02 NOTE — Telephone Encounter (Signed)
Last filled 12/02/13, last seen 11/04/13

## 2014-01-08 ENCOUNTER — Other Ambulatory Visit: Payer: Self-pay | Admitting: Nurse Practitioner

## 2014-02-01 ENCOUNTER — Other Ambulatory Visit: Payer: Self-pay | Admitting: Family Medicine

## 2014-02-01 DIAGNOSIS — G8929 Other chronic pain: Secondary | ICD-10-CM

## 2014-02-01 DIAGNOSIS — M545 Low back pain, unspecified: Secondary | ICD-10-CM

## 2014-02-01 DIAGNOSIS — M48061 Spinal stenosis, lumbar region without neurogenic claudication: Secondary | ICD-10-CM

## 2014-02-01 NOTE — Telephone Encounter (Signed)
Last filled 01/02/14, last seen 11/04/13

## 2014-02-02 ENCOUNTER — Other Ambulatory Visit: Payer: Self-pay | Admitting: Family Medicine

## 2014-02-02 ENCOUNTER — Telehealth: Payer: Self-pay | Admitting: *Deleted

## 2014-02-02 MED ORDER — HYDROCODONE-ACETAMINOPHEN 7.5-325 MG PO TABS: 1.0000 | ORAL_TABLET | Freq: Four times a day (QID) | ORAL | Status: DC | PRN

## 2014-02-02 NOTE — Telephone Encounter (Signed)
Left message for pt informing RX for Hydrocodone is ready fr pick up RX to front

## 2014-03-07 ENCOUNTER — Ambulatory Visit (INDEPENDENT_AMBULATORY_CARE_PROVIDER_SITE_OTHER): Payer: Medicare PPO

## 2014-03-07 DIAGNOSIS — Z23 Encounter for immunization: Secondary | ICD-10-CM

## 2014-03-14 ENCOUNTER — Telehealth: Payer: Self-pay | Admitting: Family Medicine

## 2014-03-14 ENCOUNTER — Other Ambulatory Visit: Payer: Self-pay | Admitting: Family Medicine

## 2014-03-14 DIAGNOSIS — M545 Low back pain: Principal | ICD-10-CM

## 2014-03-14 DIAGNOSIS — G8929 Other chronic pain: Secondary | ICD-10-CM

## 2014-03-14 DIAGNOSIS — M48061 Spinal stenosis, lumbar region without neurogenic claudication: Secondary | ICD-10-CM

## 2014-03-14 MED ORDER — LORAZEPAM 0.5 MG PO TABS: ORAL_TABLET | ORAL | Status: DC

## 2014-03-14 MED ORDER — HYDROCODONE-ACETAMINOPHEN 7.5-325 MG PO TABS: 1.0000 | ORAL_TABLET | Freq: Four times a day (QID) | ORAL | Status: DC | PRN

## 2014-03-14 NOTE — Telephone Encounter (Signed)
Come p/u

## 2014-03-27 ENCOUNTER — Other Ambulatory Visit: Payer: Self-pay | Admitting: *Deleted

## 2014-03-27 MED ORDER — LISINOPRIL-HYDROCHLOROTHIAZIDE 20-12.5 MG PO TABS: 2.0000 | ORAL_TABLET | Freq: Every day | ORAL | Status: DC

## 2014-05-08 ENCOUNTER — Other Ambulatory Visit: Payer: Self-pay | Admitting: Family Medicine

## 2014-05-08 DIAGNOSIS — M48061 Spinal stenosis, lumbar region without neurogenic claudication: Secondary | ICD-10-CM

## 2014-05-08 DIAGNOSIS — M545 Low back pain: Principal | ICD-10-CM

## 2014-05-08 DIAGNOSIS — G8929 Other chronic pain: Secondary | ICD-10-CM

## 2014-05-09 ENCOUNTER — Other Ambulatory Visit: Payer: Self-pay | Admitting: Family Medicine

## 2014-05-09 DIAGNOSIS — M545 Low back pain: Principal | ICD-10-CM

## 2014-05-09 DIAGNOSIS — M48061 Spinal stenosis, lumbar region without neurogenic claudication: Secondary | ICD-10-CM

## 2014-05-09 DIAGNOSIS — G8929 Other chronic pain: Secondary | ICD-10-CM

## 2014-05-09 MED ORDER — HYDROCODONE-ACETAMINOPHEN 7.5-325 MG PO TABS: 1.0000 | ORAL_TABLET | Freq: Four times a day (QID) | ORAL | Status: DC | PRN

## 2014-05-11 ENCOUNTER — Telehealth: Payer: Self-pay | Admitting: Family Medicine

## 2014-05-11 NOTE — Telephone Encounter (Signed)
Rx up front, pt aware 

## 2014-05-15 ENCOUNTER — Encounter: Payer: Self-pay | Admitting: Family Medicine

## 2014-05-15 ENCOUNTER — Ambulatory Visit (INDEPENDENT_AMBULATORY_CARE_PROVIDER_SITE_OTHER): Payer: Medicare PPO | Admitting: Family Medicine

## 2014-05-15 ENCOUNTER — Ambulatory Visit (INDEPENDENT_AMBULATORY_CARE_PROVIDER_SITE_OTHER): Payer: Medicare PPO

## 2014-05-15 VITALS — BP 150/64 | HR 77 | Temp 97.7°F | Ht 61.0 in | Wt 105.0 lb

## 2014-05-15 DIAGNOSIS — M25512 Pain in left shoulder: Secondary | ICD-10-CM

## 2014-05-15 DIAGNOSIS — I10 Essential (primary) hypertension: Secondary | ICD-10-CM

## 2014-05-15 MED ORDER — LISINOPRIL-HYDROCHLOROTHIAZIDE 20-12.5 MG PO TABS: 2.0000 | ORAL_TABLET | Freq: Every day | ORAL | Status: DC

## 2014-05-15 MED ORDER — METOPROLOL SUCCINATE ER 25 MG PO TB24: 25.0000 mg | ORAL_TABLET | Freq: Every day | ORAL | Status: DC

## 2014-05-15 MED ORDER — NAPROXEN 250 MG PO TABS: 250.0000 mg | ORAL_TABLET | Freq: Two times a day (BID) | ORAL | Status: DC

## 2014-05-15 NOTE — Progress Notes (Signed)
   Subjective:    Patient ID: Vicki Gomez, female    DOB: 26-Feb-1927, 81 y.o.   MRN: [id number]  HPI C/o fall and left shoulder discomfort.  She needs refills on her blood pressure medicine.  Review of Systems  Constitutional: Negative for fever.  HENT: Negative for ear pain.   Eyes: Negative for discharge.  Respiratory: Negative for cough.   Cardiovascular: Negative for chest pain.  Gastrointestinal: Negative for abdominal distention.  Endocrine: Negative for polyuria.  Genitourinary: Negative for difficulty urinating.  Musculoskeletal: Positive for arthralgias. Negative for gait problem and neck pain.  Skin: Negative for color change and rash.  Neurological: Negative for speech difficulty and headaches.  Psychiatric/Behavioral: Negative for agitation.       Objective:    Ht 5\' 1"  (1.549 m) Physical Exam  Constitutional: She is oriented to person, place, and time. She appears well-developed and well-nourished.  HENT:  Head: Normocephalic and atraumatic.  Mouth/Throat: Oropharynx is clear and moist.  Eyes: Pupils are equal, round, and reactive to light.  Neck: Normal range of motion. Neck supple.  Cardiovascular: Normal rate and regular rhythm.   No murmur heard. Pulmonary/Chest: Effort normal and breath sounds normal.  Abdominal: Soft. Bowel sounds are normal. There is no tenderness.  Musculoskeletal:  TTP left shoulder and decreased ROM with abduction.  No deformities.  Neurological: She is alert and oriented to person, place, and time.  Skin: Skin is warm and dry.  Psychiatric: She has a normal mood and affect.   Xray Left shoulder - No fracture Prelimnary reading by Iverson Alamin       Assessment & Plan:     ICD-9-CM ICD-10-CM   1. Left shoulder pain 719.41 M25.512 DG Shoulder Left     No Follow-up on file.  Lysbeth Penner FNP

## 2014-05-24 ENCOUNTER — Other Ambulatory Visit: Payer: Self-pay | Admitting: *Deleted

## 2014-05-24 ENCOUNTER — Other Ambulatory Visit: Payer: Self-pay | Admitting: Family Medicine

## 2014-05-24 DIAGNOSIS — M48061 Spinal stenosis, lumbar region without neurogenic claudication: Secondary | ICD-10-CM

## 2014-05-24 DIAGNOSIS — G8929 Other chronic pain: Secondary | ICD-10-CM

## 2014-05-24 DIAGNOSIS — M545 Low back pain, unspecified: Secondary | ICD-10-CM

## 2014-05-24 MED ORDER — HYDROCODONE-ACETAMINOPHEN 7.5-325 MG PO TABS: 1.0000 | ORAL_TABLET | Freq: Four times a day (QID) | ORAL | Status: DC | PRN

## 2014-05-24 NOTE — Telephone Encounter (Signed)
Patient had a refill request for hydrocodone from pharmacy and daughter stated that she was ok at this time she just had it filled on 1/4 and that she did not need this prescription at this time.

## 2014-06-15 ENCOUNTER — Ambulatory Visit: Payer: Medicare PPO | Admitting: Family Medicine

## 2014-07-07 ENCOUNTER — Other Ambulatory Visit: Payer: Self-pay | Admitting: Family Medicine

## 2014-08-01 ENCOUNTER — Telehealth: Payer: Self-pay | Admitting: Family Medicine

## 2014-08-02 ENCOUNTER — Telehealth: Payer: Self-pay | Admitting: Family Medicine

## 2014-08-02 NOTE — Telephone Encounter (Signed)
Pt ntbs for refills appt scheduled

## 2014-08-06 ENCOUNTER — Other Ambulatory Visit: Payer: Self-pay | Admitting: Family Medicine

## 2014-08-10 ENCOUNTER — Ambulatory Visit (INDEPENDENT_AMBULATORY_CARE_PROVIDER_SITE_OTHER): Payer: Medicare PPO | Admitting: Family Medicine

## 2014-08-10 ENCOUNTER — Encounter: Payer: Self-pay | Admitting: Family Medicine

## 2014-08-10 ENCOUNTER — Ambulatory Visit (INDEPENDENT_AMBULATORY_CARE_PROVIDER_SITE_OTHER): Payer: Medicare PPO

## 2014-08-10 VITALS — BP 148/66 | HR 65 | Temp 97.5°F | Ht 61.0 in | Wt 100.2 lb

## 2014-08-10 DIAGNOSIS — M8580 Other specified disorders of bone density and structure, unspecified site: Secondary | ICD-10-CM | POA: Diagnosis not present

## 2014-08-10 DIAGNOSIS — M5440 Lumbago with sciatica, unspecified side: Secondary | ICD-10-CM

## 2014-08-10 DIAGNOSIS — G8929 Other chronic pain: Secondary | ICD-10-CM | POA: Diagnosis not present

## 2014-08-10 DIAGNOSIS — M4120 Other idiopathic scoliosis, site unspecified: Secondary | ICD-10-CM

## 2014-08-10 DIAGNOSIS — M4806 Spinal stenosis, lumbar region: Secondary | ICD-10-CM

## 2014-08-10 DIAGNOSIS — Z853 Personal history of malignant neoplasm of breast: Secondary | ICD-10-CM | POA: Diagnosis not present

## 2014-08-10 DIAGNOSIS — I10 Essential (primary) hypertension: Secondary | ICD-10-CM | POA: Diagnosis not present

## 2014-08-10 DIAGNOSIS — M48061 Spinal stenosis, lumbar region without neurogenic claudication: Secondary | ICD-10-CM

## 2014-08-10 DIAGNOSIS — M545 Low back pain: Secondary | ICD-10-CM

## 2014-08-10 MED ORDER — RALOXIFENE HCL 60 MG PO TABS: 60.0000 mg | ORAL_TABLET | Freq: Every day | ORAL | Status: DC

## 2014-08-10 MED ORDER — HYDROCODONE-ACETAMINOPHEN 7.5-325 MG PO TABS: 1.0000 | ORAL_TABLET | Freq: Four times a day (QID) | ORAL | Status: DC | PRN

## 2014-08-10 MED ORDER — LORAZEPAM 0.5 MG PO TABS: ORAL_TABLET | ORAL | Status: DC

## 2014-08-10 MED ORDER — LISINOPRIL-HYDROCHLOROTHIAZIDE 20-12.5 MG PO TABS: 2.0000 | ORAL_TABLET | Freq: Every day | ORAL | Status: DC

## 2014-08-10 MED ORDER — METOPROLOL SUCCINATE ER 25 MG PO TB24: 25.0000 mg | ORAL_TABLET | Freq: Every day | ORAL | Status: DC

## 2014-08-10 NOTE — Progress Notes (Signed)
Subjective:  Patient ID: Vicki Gomez, female    DOB: 08-06-1926  Age: 81 y.o. MRN: [id number]  CC: Back Pain   HPI Vicki Gomez presents for long term pain. Out of med 2 weeks. Groaning in pain. Daughter states it is not unusual for her to have this kind of pain for at [place] decade. Pt. Has had some back pain since young adulthood based on scoliosis. Pain is 8/10, but minimal history from pt. Most from daughter. Pain is at lumbar midline l3-5 and at lft hip. No recent change in distribution.    follow-up of hypertension. Patient has no history of headache chest pain or shortness of breath or recent cough. Patient also denies symptoms of TIA such as numbness weakness lateralizing. Patient checks  blood pressure at home and has not had any elevated readings recently. Patient denies side effects from his medication. States taking it regularly.  History Vicki Gomez has a past medical history of Hard of hearing; Hypertension; Anxiety; Chronic back pain; and Spinal stenosis of lumbar region.   She has no past surgical history on file.   Her family history is not on file.She reports that she has quit smoking. She does not have any smokeless tobacco history on file. She reports that she does not drink alcohol or use illicit drugs.  Current Outpatient Prescriptions on File Prior to Visit  Medication Sig Dispense Refill  . citalopram (CELEXA) 20 MG tablet Take 1 tablet (20 mg total) by mouth daily. 30 tablet 11   No current facility-administered medications on file prior to visit.    ROS Review of Systems  Unable to perform ROS   Objective:  BP 148/66 mmHg  Pulse 65  Temp(Src) 97.5 F (36.4 C) (Oral)  Ht 5\' 1"  (1.549 m)  Wt 100 lb 3.2 oz (45.45 kg)  BMI 18.94 kg/m2  BP Readings from Last 3 Encounters:  08/10/14 148/66  05/15/14 150/64  11/04/13 180/77    Wt Readings from Last 3 Encounters:  08/10/14 100 lb 3.2 oz (45.45 kg)  05/15/14 105 lb (47.628 kg)  11/04/13 106 lb  (48.081 kg)     Physical Exam  Constitutional: She is oriented to person, place, and time. She appears well-nourished. She appears distressed.  Elderly, thin, in pain  HENT:  Head: Normocephalic and atraumatic.  Right Ear: External ear normal.  Left Ear: External ear normal.  Nose: Nose normal.  Mouth/Throat: Oropharynx is clear and moist.  Eyes: Conjunctivae and EOM are normal. Pupils are equal, round, and reactive to light.  Neck: Normal range of motion. Neck supple. No thyromegaly present.  Cardiovascular: Normal rate, regular rhythm and normal heart sounds.   No murmur heard. Pulmonary/Chest: Effort normal and breath sounds normal. No respiratory distress. She has no wheezes. She has no rales.  Abdominal: Soft. Bowel sounds are normal. She exhibits no distension. There is no tenderness.  Musculoskeletal: She exhibits tenderness (at lumbar and left hip for moderate palp.).  Lymphadenopathy:    She has no cervical adenopathy.  Neurological: She is alert and oriented to person, place, and time. She has normal reflexes.  Skin: Skin is warm and dry.  Psychiatric: She has a normal mood and affect. Her behavior is normal. Judgment and thought content normal.    Lab Results  Component Value Date   HGBA1C 6.0 09/23/2012    Lab Results  Component Value Date   WBC 7.2 03/24/2013   HGB 14.3 03/24/2013   HCT 44.3 03/24/2013   GLUCOSE 103* 07/01/2013  CHOL 276* 09/23/2012   TRIG 63 09/23/2012   HDL 91 09/23/2012   LDLCALC 172* 09/23/2012   ALT 12 03/24/2013   AST 19 03/24/2013   NA 143 07/01/2013   K 3.9 07/01/2013   CL 101 07/01/2013   CREATININE 0.87 07/01/2013   BUN 14 07/01/2013   CO2 27 07/01/2013   TSH 1.132 09/23/2012   HGBA1C 6.0 09/23/2012    Nm Pet Tum Img Skull Base T - Thigh  06/09/2006   Clinical Data: History of left breast cancer. Patient is currently taking oral chemotherapy.  Recently had a chest CT from [facility] dated 05/26/06 which  demonstrated multiple pulmonary opacities suspicious for tumor. FDG PET-CT TUMOR IMAGING (SKULL BASE TO THIGHS): Patient Weight:  63 lb. Fasting Blood Glucose:  99. Technique:  17.3 mCi F-18 FDG was injected intravenously via the right antecubital fossa.  Full-ring PET imaging was performed from the skull base through the mid-thighs 65 minutes after injection.  CT data was obtained and used for attenuation correction and anatomic localization only.  (This was not acquired as a diagnostic CT examination). Comparison:  Report from [facility] as above.  Findings:  No hypermetabolic lymph nodes are seen within the soft tissues of the neck. The patient is status post left axillary lymph node dissection and left mastectomy.  No hypermetabolic left axillary or left retropectoral lymph nodes are noted.  There is no hypermetabolic right axillary or right retropectoral lymphadenopathy.  No supraclavicular adenopathy. Calcified precarinal lymph node is noted with mild nonspecific increased uptake consistent with prior granulomatous inflammation/infection. No hypermetabolic hilar lymph nodes. There are multifocal bilateral areas of mild increased FDG uptake within both lungs.  For example, in the left upper lobe, there is an ill-defined area of ground glass opacification which measures 2.0 x 1.1 cm (image 70). There is low level increased FDG uptake within this region with an SUV max equal to 0.5. A nonspecific area of atelectasis and/or scarring is seen within the anterior portion of the right upper lobe (image 84). There is mild low level FDG uptake within this region. Within the right lower lobe, there is a 6.6 mm nodule (image 93) which is too small to reliably characterize.  There is mild increased FDG uptake within this nodule with an SUV max equal to 0.6. Within the left upper lobe, there is a pulmonary nodule which measures 10.2 mm.  Again, there is low level FDG uptake with an SUV max equal to 0.6. No  abnormal FDG uptake is seen within the liver parenchyma. The spleen is negative.  There is no abnormal uptake within the adrenal glands.  The kidneys are both negative. No hypermetabolic pelvic lymph nodes are identified. There are no hypermetabolic foci seen within the visualized axial and/or appendicular skeleton. Review of the bone windows shows a sclerotic lesion within the right iliac bone. There is mild asymmetric increased uptake in this region on the corresponding PET images. IMPRESSION: 1.  The CT portion of today's exam demonstratesmultifocal bilateral pulmonary nodules and ground glass opacities within both lungs.  In the setting of breast cancer, findings would be concerning for pulmonary metastatic.  On the PET portion of the exam the degree of FDG uptake is equivocal for tumor. If the patient has prior CT scans performed before 05/26/06, comparison with these would be recommended to evaluate for change.  In the absence of previous exams, I would recommend a follow-up exam in 3 months. 2.  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  Despite this the absence of significant FDG uptake, a whole body bone scan would be recommended, as sclerotic bone metastasis may be hypometabolic on PET exam.  Provider: Merton Border   Assessment & Plan:   Vicki Gomez was seen today for back pain.  Diagnoses and all orders for this visit:  HX: breast cancer  Low back pain with sciatica, sciatica laterality unspecified, unspecified back pain laterality Orders: -     DG Lumbar Spine 2-3 Views; Future  Chronic low back pain Orders: -     HYDROcodone-acetaminophen (NORCO) 7.5-325 MG per tablet; Take 1 tablet by mouth every 6 (six) hours as needed for moderate pain. -     LORazepam (ATIVAN) 0.5 MG tablet; TAKE 1 TABLET EVERY 12 TO 24 HOURS AS NEEDED  Spinal stenosis of lumbar region Orders: -     HYDROcodone-acetaminophen  (NORCO) 7.5-325 MG per tablet; Take 1 tablet by mouth every 6 (six) hours as needed for moderate pain. -     LORazepam (ATIVAN) 0.5 MG tablet; TAKE 1 TABLET EVERY 12 TO 24 HOURS AS NEEDED  Essential hypertension Orders: -     lisinopril-hydrochlorothiazide (ZESTORETIC) 20-12.5 MG per tablet; Take 2 tablets by mouth daily. -     metoprolol succinate (TOPROL-XL) 25 MG 24 hr tablet; Take 1 tablet (25 mg total) by mouth daily.  Idiopathic scoliosis  Osteopenia  Other orders -     raloxifene (EVISTA) 60 MG tablet; Take 1 tablet (60 mg total) by mouth daily.   I have discontinued Vicki Gomez's naproxen. I am also having her start on raloxifene. Additionally, I am having her maintain her citalopram, HYDROcodone-acetaminophen, LORazepam, lisinopril-hydrochlorothiazide, and metoprolol succinate.  Meds ordered this encounter  Medications  . HYDROcodone-acetaminophen (NORCO) 7.5-325 MG per tablet    Sig: Take 1 tablet by mouth every 6 (six) hours as needed for moderate pain.    Dispense:  90 tablet    Refill:  0  . LORazepam (ATIVAN) 0.5 MG tablet    Sig: TAKE 1 TABLET EVERY 12 TO 24 HOURS AS NEEDED    Dispense:  60 tablet    Refill:  3  . lisinopril-hydrochlorothiazide (ZESTORETIC) 20-12.5 MG per tablet    Sig: Take 2 tablets by mouth daily.    Dispense:  60 tablet    Refill:  11  . raloxifene (EVISTA) 60 MG tablet    Sig: Take 1 tablet (60 mg total) by mouth daily.    Dispense:  30 tablet    Refill:  11  . metoprolol succinate (TOPROL-XL) 25 MG 24 hr tablet    Sig: Take 1 tablet (25 mg total) by mouth daily.    Dispense:  30 tablet    Refill:  5   Back XR shows osteopenia and dextroscoliosis  Follow-up: Return in about 3 months (around 11/09/2014).  Claretta Fraise, M.D.

## 2014-08-22 ENCOUNTER — Ambulatory Visit: Payer: Medicare PPO | Admitting: Family Medicine

## 2014-09-20 ENCOUNTER — Encounter: Payer: Self-pay | Admitting: *Deleted

## 2014-09-22 ENCOUNTER — Other Ambulatory Visit: Payer: Self-pay | Admitting: Family Medicine

## 2014-09-22 DIAGNOSIS — M48061 Spinal stenosis, lumbar region without neurogenic claudication: Secondary | ICD-10-CM

## 2014-09-22 DIAGNOSIS — M545 Low back pain, unspecified: Secondary | ICD-10-CM

## 2014-09-22 DIAGNOSIS — G8929 Other chronic pain: Secondary | ICD-10-CM

## 2014-09-26 ENCOUNTER — Telehealth: Payer: Self-pay | Admitting: Family Medicine

## 2014-09-26 DIAGNOSIS — M545 Low back pain: Principal | ICD-10-CM

## 2014-09-26 DIAGNOSIS — G8929 Other chronic pain: Secondary | ICD-10-CM

## 2014-09-26 DIAGNOSIS — M48061 Spinal stenosis, lumbar region without neurogenic claudication: Secondary | ICD-10-CM

## 2014-09-27 MED ORDER — HYDROCODONE-ACETAMINOPHEN 7.5-325 MG PO TABS: 1.0000 | ORAL_TABLET | Freq: Four times a day (QID) | ORAL | Status: DC | PRN

## 2014-09-27 NOTE — Telephone Encounter (Signed)
Last seen and filled by Dr. Livia Snellen on 08/10/14.

## 2014-11-07 ENCOUNTER — Ambulatory Visit: Payer: Medicare PPO | Admitting: Family Medicine

## 2014-11-10 ENCOUNTER — Ambulatory Visit: Payer: Medicare PPO | Admitting: Family Medicine

## 2014-11-10 ENCOUNTER — Encounter: Payer: Self-pay | Admitting: Family Medicine

## 2014-11-14 ENCOUNTER — Other Ambulatory Visit: Payer: Self-pay | Admitting: Family Medicine

## 2014-11-14 NOTE — Telephone Encounter (Signed)
Pt notified. appt scheduled.

## 2014-11-14 NOTE — Telephone Encounter (Signed)
This would require an office visit 

## 2014-11-17 ENCOUNTER — Encounter: Payer: Self-pay | Admitting: Family Medicine

## 2014-11-17 ENCOUNTER — Ambulatory Visit (INDEPENDENT_AMBULATORY_CARE_PROVIDER_SITE_OTHER): Payer: Medicare PPO | Admitting: Family Medicine

## 2014-11-17 VITALS — BP 148/66 | HR 75 | Temp 98.0°F | Ht 61.0 in | Wt 101.4 lb

## 2014-11-17 DIAGNOSIS — M4806 Spinal stenosis, lumbar region: Secondary | ICD-10-CM | POA: Diagnosis not present

## 2014-11-17 DIAGNOSIS — G8929 Other chronic pain: Secondary | ICD-10-CM

## 2014-11-17 DIAGNOSIS — I10 Essential (primary) hypertension: Secondary | ICD-10-CM

## 2014-11-17 DIAGNOSIS — M545 Low back pain: Secondary | ICD-10-CM | POA: Diagnosis not present

## 2014-11-17 DIAGNOSIS — R010 Benign and innocent cardiac murmurs: Secondary | ICD-10-CM

## 2014-11-17 DIAGNOSIS — R011 Cardiac murmur, unspecified: Secondary | ICD-10-CM

## 2014-11-17 DIAGNOSIS — M48061 Spinal stenosis, lumbar region without neurogenic claudication: Secondary | ICD-10-CM

## 2014-11-17 MED ORDER — HYDROCODONE-ACETAMINOPHEN 7.5-325 MG PO TABS: 1.0000 | ORAL_TABLET | Freq: Four times a day (QID) | ORAL | Status: DC | PRN

## 2014-11-17 NOTE — Addendum Note (Signed)
Addended by: Claretta Fraise on: 11/17/2014 08:00 PM   Modules accepted: Miquel Dunn

## 2014-11-17 NOTE — Progress Notes (Signed)
Subjective:  Patient ID: Vicki Gomez, female    DOB: 22-Aug-1926  Age: 81 y.o. MRN: [id number]  CC: Back Pain and Hypertension   HPI Vicki Gomez presents for  follow-up of hypertension. Patient has no history of headache chest pain or shortness of breath or recent cough. Patient also denies symptoms of TIA such as numbness weakness lateralizing. Patient checks  blood pressure at home and has not had any elevated readings recently. Patient denies side effects from his medication. States taking it regularly.  Pain is severe at the midline lower back. It has been stable for many months on her current regimen of medication noted below. It does not radiate. She has a daughter attending with her who is her caretaker. She states she gives it to her regularly all she does is moan with pain. The patient is very hard of hearing. History is obtained primarily from the daughter.   History Vicki Gomez has a past medical history of Hard of hearing; Hypertension; Anxiety; Chronic back pain; and Spinal stenosis of lumbar region.   She has no past surgical history on file.   Her family history is not on file.She reports that she has quit smoking. She does not have any smokeless tobacco history on file. She reports that she does not drink alcohol or use illicit drugs.  Current Outpatient Prescriptions on File Prior to Visit  Medication Sig Dispense Refill  . citalopram (CELEXA) 20 MG tablet Take 1 tablet (20 mg total) by mouth daily. 30 tablet 11  . lisinopril-hydrochlorothiazide (ZESTORETIC) 20-12.5 MG per tablet Take 2 tablets by mouth daily. 60 tablet 11  . LORazepam (ATIVAN) 0.5 MG tablet TAKE 1 TABLET EVERY 12 TO 24 HOURS AS NEEDED 60 tablet 3  . metoprolol succinate (TOPROL-XL) 25 MG 24 hr tablet Take 1 tablet (25 mg total) by mouth daily. 30 tablet 5  . raloxifene (EVISTA) 60 MG tablet Take 1 tablet (60 mg total) by mouth daily. 30 tablet 11   No current facility-administered medications on file  prior to visit.    ROS Review of Systems  Constitutional: Negative for fever, chills, diaphoresis, appetite change, fatigue and unexpected weight change.  HENT: Positive for hearing loss. Negative for congestion, ear pain and trouble swallowing.   Eyes: Negative for pain.  Respiratory: Negative for cough, chest tightness and shortness of breath.   Cardiovascular: Negative for chest pain and palpitations.  Gastrointestinal: Negative for nausea, vomiting, abdominal pain, diarrhea and constipation.  Genitourinary: Negative for dysuria, frequency and menstrual problem.  Musculoskeletal: Positive for back pain and arthralgias. Negative for joint swelling.  Skin: Negative for rash.  Neurological: Negative for dizziness, weakness, numbness and headaches.  Psychiatric/Behavioral: Negative for dysphoric mood and agitation.    Objective:  BP 148/66 mmHg  Pulse 75  Temp(Src) 98 F (36.7 C) (Oral)  Ht $R'5\' 1"'Sz$  (1.549 m)  Wt 101 lb 6.4 oz (45.995 kg)  BMI 19.17 kg/m2  BP Readings from Last 3 Encounters:  11/17/14 148/66  08/10/14 148/66  05/15/14 150/64    Wt Readings from Last 3 Encounters:  11/17/14 101 lb 6.4 oz (45.995 kg)  08/10/14 100 lb 3.2 oz (45.45 kg)  05/15/14 105 lb (47.628 kg)     Physical Exam  Constitutional: She is oriented to person, place, and time. She appears well-developed and well-nourished. No distress.  HENT:  Head: Normocephalic and atraumatic.  Right Ear: External ear normal.  Left Ear: External ear normal.  Nose: Nose normal.  Mouth/Throat: Oropharynx is clear and  moist.  Eyes: Conjunctivae and EOM are normal. Pupils are equal, round, and reactive to light.  Neck: Normal range of motion. Neck supple. No thyromegaly present.  Cardiovascular: Normal rate and regular rhythm.   Murmur (:2/6 holosystolic murmur) heard. Pulmonary/Chest: Effort normal and breath sounds normal. No respiratory distress. She has no wheezes. She has no rales.  Abdominal: Soft.  Bowel sounds are normal. She exhibits no distension. There is no tenderness.  Musculoskeletal: She exhibits tenderness (L5And bilateral SI joints).  Lymphadenopathy:    She has no cervical adenopathy.  Neurological: She is alert and oriented to person, place, and time. She has normal reflexes.  Skin: Skin is warm and dry.  Psychiatric: She has a normal mood and affect. Her behavior is normal. Judgment and thought content normal.    Lab Results  Component Value Date   HGBA1C 6.0 09/23/2012    Lab Results  Component Value Date   WBC 7.2 03/24/2013   HGB 14.3 03/24/2013   HCT 44.3 03/24/2013   GLUCOSE 103* 07/01/2013   CHOL 276* 09/23/2012   TRIG 63 09/23/2012   HDL 91 09/23/2012   LDLCALC 172* 09/23/2012   ALT 12 03/24/2013   AST 19 03/24/2013   NA 143 07/01/2013   K 3.9 07/01/2013   CL 101 07/01/2013   CREATININE 0.87 07/01/2013   BUN 14 07/01/2013   CO2 27 07/01/2013   TSH 1.132 09/23/2012   HGBA1C 6.0 09/23/2012    Nm Pet Tum Img Skull Base T - Thigh  06/09/2006   Clinical Data: History of left breast cancer. Patient is currently taking oral chemotherapy.  Recently had a chest CT from [facility] dated 05/26/06 which demonstrated multiple pulmonary opacities suspicious for tumor. FDG PET-CT TUMOR IMAGING (SKULL BASE TO THIGHS): Patient Weight:  63 lb. Fasting Blood Glucose:  99. Technique:  17.3 mCi F-18 FDG was injected intravenously via the right antecubital fossa.  Full-ring PET imaging was performed from the skull base through the mid-thighs 65 minutes after injection.  CT data was obtained and used for attenuation correction and anatomic localization only.  (This was not acquired as a diagnostic CT examination). Comparison:  Report from [facility] as above.  Findings:  No hypermetabolic lymph nodes are seen within the soft tissues of the neck. The patient is status post left axillary lymph node dissection and left mastectomy.  No  hypermetabolic left axillary or left retropectoral lymph nodes are noted.  There is no hypermetabolic right axillary or right retropectoral lymphadenopathy.  No supraclavicular adenopathy. Calcified precarinal lymph node is noted with mild nonspecific increased uptake consistent with prior granulomatous inflammation/infection. No hypermetabolic hilar lymph nodes. There are multifocal bilateral areas of mild increased FDG uptake within both lungs.  For example, in the left upper lobe, there is an ill-defined area of ground glass opacification which measures 2.0 x 1.1 cm (image 70). There is low level increased FDG uptake within this region with an SUV max equal to 0.5. A nonspecific area of atelectasis and/or scarring is seen within the anterior portion of the right upper lobe (image 84). There is mild low level FDG uptake within this region. Within the right lower lobe, there is a 6.6 mm nodule (image 93) which is too small to reliably characterize.  There is mild increased FDG uptake within this nodule with an SUV max equal to 0.6. Within the left upper lobe, there is a pulmonary nodule which measures 10.2 mm.  Again, there is low level FDG  uptake with an SUV max equal to 0.6. No abnormal FDG uptake is seen within the liver parenchyma. The spleen is negative.  There is no abnormal uptake within the adrenal glands.  The kidneys are both negative. No hypermetabolic pelvic lymph nodes are identified. There are no hypermetabolic foci seen within the visualized axial and/or appendicular skeleton. Review of the bone windows shows a sclerotic lesion within the right iliac bone. There is mild asymmetric increased uptake in this region on the corresponding PET images. IMPRESSION: 1.  The CT portion of today's exam demonstratesmultifocal bilateral pulmonary nodules and ground glass opacities within both lungs.  In the setting of breast cancer, findings would be concerning for pulmonary metastatic.  On the PET portion of the  exam the degree of FDG uptake is equivocal for tumor. If the patient has prior CT scans performed before 05/26/06, comparison with these would be recommended to evaluate for change.  In the absence of previous exams, I would recommend a follow-up exam in 3 months. 2.  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  Despite this the absence of significant FDG uptake, a whole body bone scan would be recommended, as sclerotic bone metastasis may be hypometabolic on PET exam.  Provider: Merton Border   Assessment & Plan:   [place] was seen today for back pain and hypertension.  Diagnoses and all orders for this visit:  Essential hypertension Orders: -     CMP14+EGFR  Chronic low back pain Orders: -     CMP14+EGFR -     HYDROcodone-acetaminophen (NORCO) 7.5-325 MG per tablet; Take 1 tablet by mouth every 6 (six) hours as needed for moderate pain.  Spinal stenosis of lumbar region Orders: -     CMP14+EGFR -     HYDROcodone-acetaminophen (NORCO) 7.5-325 MG per tablet; Take 1 tablet by mouth every 6 (six) hours as needed for moderate pain.  Undiagnosed cardiac murmurs Orders: -     Echocardiogram; Future  Other orders -     HYDROcodone-acetaminophen (NORCO) 7.5-325 MG per tablet; Take 1 tablet by mouth every 6 (six) hours as needed for moderate pain. -     HYDROcodone-acetaminophen (NORCO) 7.5-325 MG per tablet; Take 1 tablet by mouth every 6 (six) hours as needed for moderate pain.   I am having Ms. Gavina start on HYDROcodone-acetaminophen and HYDROcodone-acetaminophen. I am also having her maintain her citalopram, LORazepam, lisinopril-hydrochlorothiazide, raloxifene, metoprolol succinate, and HYDROcodone-acetaminophen.  Meds ordered this encounter  Medications  . HYDROcodone-acetaminophen (NORCO) 7.5-325 MG per tablet    Sig: Take 1 tablet by mouth every 6 (six) hours as needed for moderate pain.     Dispense:  90 tablet    Refill:  0  . HYDROcodone-acetaminophen (NORCO) 7.5-325 MG per tablet    Sig: Take 1 tablet by mouth every 6 (six) hours as needed for moderate pain.    Dispense:  90 tablet    Refill:  0    Do not fill until 12/18/2014  . HYDROcodone-acetaminophen (NORCO) 7.5-325 MG per tablet    Sig: Take 1 tablet by mouth every 6 (six) hours as needed for moderate pain.    Dispense:  90 tablet    Refill:  0    Do not fill until 01/17/2015     Follow-up: Return in about 3 months (around 02/17/2015).  Vicki Gomez, M.D.

## 2014-11-18 LAB — CMP14+EGFR
A/G RATIO: 1.5 (ref 1.1–2.5)
ALK PHOS: 62 IU/L (ref 39–117)
ALT: 9 IU/L (ref 0–32)
AST: 19 IU/L (ref 0–40)
Albumin: 4 g/dL (ref 3.5–4.7)
BILIRUBIN TOTAL: 0.6 mg/dL (ref 0.0–1.2)
BUN / CREAT RATIO: 18 (ref 11–26)
BUN: 18 mg/dL (ref 8–27)
CO2: 27 mmol/L (ref 18–29)
CREATININE: 0.99 mg/dL (ref 0.57–1.00)
Calcium: 9.4 mg/dL (ref 8.7–10.3)
Chloride: 99 mmol/L (ref 97–108)
GFR calc non Af Amer: 52 mL/min/{1.73_m2} — ABNORMAL LOW (ref >59)
GFR, EST AFRICAN AMERICAN: 60 mL/min/{1.73_m2} (ref >59)
GLOBULIN, TOTAL: 2.7 g/dL (ref 1.5–4.5)
GLUCOSE: 79 mg/dL (ref 65–99)
Potassium: 4.8 mmol/L (ref 3.5–5.2)
SODIUM: 142 mmol/L (ref 134–144)
Total Protein: 6.7 g/dL (ref 6.0–8.5)

## 2014-11-21 ENCOUNTER — Encounter: Payer: Self-pay | Admitting: *Deleted

## 2014-11-22 ENCOUNTER — Ambulatory Visit: Payer: Medicare PPO | Admitting: Family Medicine

## 2014-12-12 ENCOUNTER — Other Ambulatory Visit: Payer: Self-pay | Admitting: *Deleted

## 2014-12-12 DIAGNOSIS — I10 Essential (primary) hypertension: Secondary | ICD-10-CM

## 2014-12-12 MED ORDER — METOPROLOL SUCCINATE ER 25 MG PO TB24: 25.0000 mg | ORAL_TABLET | Freq: Every day | ORAL | Status: DC

## 2014-12-26 ENCOUNTER — Other Ambulatory Visit: Payer: Self-pay

## 2014-12-26 ENCOUNTER — Telehealth: Payer: Self-pay | Admitting: Family Medicine

## 2014-12-26 DIAGNOSIS — G8929 Other chronic pain: Secondary | ICD-10-CM

## 2014-12-26 DIAGNOSIS — M48061 Spinal stenosis, lumbar region without neurogenic claudication: Secondary | ICD-10-CM

## 2014-12-26 DIAGNOSIS — M545 Low back pain: Principal | ICD-10-CM

## 2014-12-26 MED ORDER — CITALOPRAM HYDROBROMIDE 20 MG PO TABS: 20.0000 mg | ORAL_TABLET | Freq: Every day | ORAL | Status: DC

## 2014-12-26 NOTE — Telephone Encounter (Signed)
x

## 2015-01-01 ENCOUNTER — Ambulatory Visit (INDEPENDENT_AMBULATORY_CARE_PROVIDER_SITE_OTHER): Payer: Medicare PPO | Admitting: Family Medicine

## 2015-01-01 ENCOUNTER — Encounter: Payer: Self-pay | Admitting: Family Medicine

## 2015-01-01 VITALS — BP 127/54 | HR 60 | Temp 97.5°F | Ht 61.0 in | Wt 96.6 lb

## 2015-01-01 DIAGNOSIS — R791 Abnormal coagulation profile: Secondary | ICD-10-CM

## 2015-01-01 DIAGNOSIS — R7989 Other specified abnormal findings of blood chemistry: Secondary | ICD-10-CM

## 2015-01-01 DIAGNOSIS — F0280 Dementia in other diseases classified elsewhere without behavioral disturbance: Secondary | ICD-10-CM

## 2015-01-01 DIAGNOSIS — G309 Alzheimer's disease, unspecified: Secondary | ICD-10-CM | POA: Diagnosis not present

## 2015-01-01 MED ORDER — DULOXETINE HCL 20 MG PO CPEP: 20.0000 mg | ORAL_CAPSULE | Freq: Every day | ORAL | Status: DC

## 2015-01-01 NOTE — Progress Notes (Signed)
Subjective:  Patient ID: Vicki Gomez, female    DOB: Apr 21, 1927  Age: 81 y.o. MRN: [id number]  CC: Hospitalization Follow-up   HPI Vicki Gomez presents for  spells of body getting stiff, then SOB, then shakes all over. BP goes up. When she starts shaking the family gets worried and calls the ambulance. She was recently evaluated at [facility] on August 4. Multiple tests were performed. She was found to be dehydrated A CT scan of the brain was performed and the report was received and reviewed by fax showing that there was no structural abnormality. There was no EEG performed. The family says the spells have occurred on several occasions. First noted several weeks ago. Occurring less than once a week. Patient is able to communicate with the family but has severe hearing loss. Confused - disoriented to place, time. She seems to come anxious at times when her back pain flares and and then she will panic. This seems to lead to the spells. Family member feels that her anxiety is not adequately controlled with her Celexa.  History Vicki Gomez has a past medical history of Hard of hearing; Hypertension; Anxiety; Chronic back pain; and Spinal stenosis of lumbar region.   She has no past surgical history on file.   Her family history is not on file.She reports that she has quit smoking. She does not have any smokeless tobacco history on file. She reports that she does not drink alcohol or use illicit drugs.  Outpatient Prescriptions Prior to Visit  Medication Sig Dispense Refill  . HYDROcodone-acetaminophen (NORCO) 7.5-325 MG per tablet Take 1 tablet by mouth every 6 (six) hours as needed for moderate pain. 90 tablet 0  . HYDROcodone-acetaminophen (NORCO) 7.5-325 MG per tablet Take 1 tablet by mouth every 6 (six) hours as needed for moderate pain. 90 tablet 0  . lisinopril-hydrochlorothiazide (ZESTORETIC) 20-12.5 MG per tablet Take 2 tablets by mouth daily. 60 tablet 11  . LORazepam (ATIVAN)  0.5 MG tablet TAKE 1 TABLET EVERY 12 TO 24 HOURS AS NEEDED 60 tablet 3  . metoprolol succinate (TOPROL-XL) 25 MG 24 hr tablet Take 1 tablet (25 mg total) by mouth daily. 30 tablet 4  . raloxifene (EVISTA) 60 MG tablet Take 1 tablet (60 mg total) by mouth daily. 30 tablet 11  . citalopram (CELEXA) 20 MG tablet Take 1 tablet (20 mg total) by mouth daily. 30 tablet 1  . HYDROcodone-acetaminophen (NORCO) 7.5-325 MG per tablet Take 1 tablet by mouth every 6 (six) hours as needed for moderate pain. (Patient not taking: Reported on 01/01/2015) 90 tablet 0   No facility-administered medications prior to visit.    ROS Review of Systems  Constitutional: Positive for activity change. Negative for fever, chills, diaphoresis, appetite change, fatigue and unexpected weight change.  HENT: Positive for hearing loss (severe). Negative for congestion, ear pain, postnasal drip, rhinorrhea, sneezing, sore throat and trouble swallowing.   Eyes: Negative for pain.  Respiratory: Positive for shortness of breath. Negative for cough and chest tightness.   Cardiovascular: Negative for chest pain and palpitations.  Gastrointestinal: Negative for nausea, vomiting, abdominal pain, diarrhea and constipation.  Genitourinary: Negative for dysuria, frequency and menstrual problem.  Musculoskeletal: Negative for joint swelling and arthralgias.  Skin: Negative for rash.  Neurological: Positive for tremors and weakness. Negative for dizziness, numbness and headaches.  Psychiatric/Behavioral: Negative for dysphoric mood and agitation.    Objective:  BP 127/54 mmHg  Pulse 60  Temp(Src) 97.5 F (36.4 C) (Oral)  Ht  $'5\' 1"'S$  (1.549 m)  Wt 96 lb 9.6 oz (43.817 kg)  BMI 18.26 kg/m2  BP Readings from Last 3 Encounters:  01/01/15 127/54  11/17/14 148/66  08/10/14 148/66    Wt Readings from Last 3 Encounters:  01/01/15 96 lb 9.6 oz (43.817 kg)  11/17/14 101 lb 6.4 oz (45.995 kg)  08/10/14 100 lb 3.2 oz (45.45 kg)      Physical Exam  Constitutional: She is oriented to person, place, and time. She appears well-nourished. No distress.  HENT:  Head: Normocephalic and atraumatic.  Right Ear: External ear normal.  Left Ear: External ear normal.  Nose: Nose normal.  Mouth/Throat: Oropharynx is clear and moist.  Eyes: Conjunctivae and EOM are normal. Pupils are equal, round, and reactive to light.  Neck: Normal range of motion. Neck supple. No JVD present. No tracheal deviation present. No thyromegaly present.  Cardiovascular: Normal rate, regular rhythm and normal heart sounds.   No murmur heard. Pulmonary/Chest: Effort normal and breath sounds normal. No respiratory distress. She has no wheezes. She has no rales.  Abdominal: Soft. Bowel sounds are normal. She exhibits no distension. There is no tenderness.  Lymphadenopathy:    She has no cervical adenopathy.  Neurological: She is alert and oriented to person, place, and time. She has normal reflexes.  Skin: Skin is warm and dry.  Psychiatric: She has a normal mood and affect. Her behavior is normal. Judgment and thought content normal.    Lab Results  Component Value Date   HGBA1C 6.0 09/23/2012    Lab Results  Component Value Date   WBC 7.2 03/24/2013   HGB 14.3 03/24/2013   HCT 44.3 03/24/2013   GLUCOSE 79 11/17/2014   CHOL 276* 09/23/2012   TRIG 63 09/23/2012   HDL 91 09/23/2012   LDLCALC 172* 09/23/2012   ALT 9 11/17/2014   AST 19 11/17/2014   NA 142 11/17/2014   K 4.8 11/17/2014   CL 99 11/17/2014   CREATININE 0.99 11/17/2014   BUN 18 11/17/2014   CO2 27 11/17/2014   TSH 1.132 09/23/2012   HGBA1C 6.0 09/23/2012    Nm Pet Tum Img Skull Base T - Thigh  06/09/2006   Clinical Data: History of left breast cancer. Patient is currently taking oral chemotherapy.  Recently had a chest CT from [facility] dated 05/26/06 which demonstrated multiple pulmonary opacities suspicious for tumor. FDG PET-CT TUMOR IMAGING (SKULL  BASE TO THIGHS): Patient Weight:  63 lb. Fasting Blood Glucose:  99. Technique:  17.3 mCi F-18 FDG was injected intravenously via the right antecubital fossa.  Full-ring PET imaging was performed from the skull base through the mid-thighs 65 minutes after injection.  CT data was obtained and used for attenuation correction and anatomic localization only.  (This was not acquired as a diagnostic CT examination). Comparison:  Report from [facility] as above.  Findings:  No hypermetabolic lymph nodes are seen within the soft tissues of the neck. The patient is status post left axillary lymph node dissection and left mastectomy.  No hypermetabolic left axillary or left retropectoral lymph nodes are noted.  There is no hypermetabolic right axillary or right retropectoral lymphadenopathy.  No supraclavicular adenopathy. Calcified precarinal lymph node is noted with mild nonspecific increased uptake consistent with prior granulomatous inflammation/infection. No hypermetabolic hilar lymph nodes. There are multifocal bilateral areas of mild increased FDG uptake within both lungs.  For example, in the left upper lobe, there is an ill-defined area of ground glass  opacification which measures 2.0 x 1.1 cm (image 70). There is low level increased FDG uptake within this region with an SUV max equal to 0.5. A nonspecific area of atelectasis and/or scarring is seen within the anterior portion of the right upper lobe (image 84). There is mild low level FDG uptake within this region. Within the right lower lobe, there is a 6.6 mm nodule (image 93) which is too small to reliably characterize.  There is mild increased FDG uptake within this nodule with an SUV max equal to 0.6. Within the left upper lobe, there is a pulmonary nodule which measures 10.2 mm.  Again, there is low level FDG uptake with an SUV max equal to 0.6. No abnormal FDG uptake is seen within the liver parenchyma. The spleen is negative.  There is no  abnormal uptake within the adrenal glands.  The kidneys are both negative. No hypermetabolic pelvic lymph nodes are identified. There are no hypermetabolic foci seen within the visualized axial and/or appendicular skeleton. Review of the bone windows shows a sclerotic lesion within the right iliac bone. There is mild asymmetric increased uptake in this region on the corresponding PET images. IMPRESSION: 1.  The CT portion of today's exam demonstratesmultifocal bilateral pulmonary nodules and ground glass opacities within both lungs.  In the setting of breast cancer, findings would be concerning for pulmonary metastatic.  On the PET portion of the exam the degree of FDG uptake is equivocal for tumor. If the patient has prior CT scans performed before 05/26/06, comparison with these would be recommended to evaluate for change.  In the absence of previous exams, I would recommend a follow-up exam in 3 months. 2.  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  Despite this the absence of significant FDG uptake, a whole body bone scan would be recommended, as sclerotic bone metastasis may be hypometabolic on PET exam.  Provider: Merton Border   Assessment & Plan:   Vicki Gomez was seen today for hospitalization follow-up.  Diagnoses and all orders for this visit:  Alzheimer's disease -     CMP14+EGFR -     CBC With Differential -     D-dimer, quantitative (not at [facility]) -     Ambulatory referral to Neurology  Other orders -     DULoxetine (CYMBALTA) 20 MG capsule; Take 1 capsule (20 mg total) by mouth daily.   I have discontinued Ms. Barajas's citalopram. I am also having her start on DULoxetine. Additionally, I am having her maintain her LORazepam, lisinopril-hydrochlorothiazide, raloxifene, HYDROcodone-acetaminophen, HYDROcodone-acetaminophen, metoprolol succinate, and Vitamin D (Ergocalciferol).  Meds ordered this  encounter  Medications  . Vitamin D, Ergocalciferol, (DRISDOL) 50000 UNITS CAPS capsule    Sig: Take 1 capsule by mouth once a week.    Refill:  1  . DULoxetine (CYMBALTA) 20 MG capsule    Sig: Take 1 capsule (20 mg total) by mouth daily.    Dispense:  30 capsule    Refill:  3     Follow-up: Return in about 1 month (around 02/01/2015).  Claretta Fraise, M.D.

## 2015-01-03 LAB — CBC WITH DIFFERENTIAL
Basophils Absolute: 0 10*3/uL (ref 0.0–0.2)
Basos: 0 %
EOS (ABSOLUTE): 0 10*3/uL (ref 0.0–0.4)
Eos: 1 %
Hematocrit: 42.2 % (ref 34.0–46.6)
Hemoglobin: 13.9 g/dL (ref 11.1–15.9)
IMMATURE GRANS (ABS): 0 10*3/uL (ref 0.0–0.1)
Immature Granulocytes: 0 %
LYMPHS: 36 %
Lymphocytes Absolute: 2.5 10*3/uL (ref 0.7–3.1)
MCH: 30 pg (ref 26.6–33.0)
MCHC: 32.9 g/dL (ref 31.5–35.7)
MCV: 91 fL (ref 79–97)
Monocytes Absolute: 0.6 10*3/uL (ref 0.1–0.9)
Monocytes: 9 %
NEUTROS ABS: 3.8 10*3/uL (ref 1.4–7.0)
NEUTROS PCT: 54 %
RBC: 4.64 x10E6/uL (ref 3.77–5.28)
RDW: 14.5 % (ref 12.3–15.4)
WBC: 6.9 10*3/uL (ref 3.4–10.8)

## 2015-01-03 LAB — CMP14+EGFR
A/G RATIO: 1.4 (ref 1.1–2.5)
ALK PHOS: 69 IU/L (ref 39–117)
ALT: 12 IU/L (ref 0–32)
AST: 19 IU/L (ref 0–40)
Albumin: 4.3 g/dL (ref 3.5–4.7)
BUN/Creatinine Ratio: 25 (ref 11–26)
BUN: 25 mg/dL (ref 8–27)
Bilirubin Total: 0.7 mg/dL (ref 0.0–1.2)
CO2: 30 mmol/L — ABNORMAL HIGH (ref 18–29)
Calcium: 9.7 mg/dL (ref 8.7–10.3)
Chloride: 95 mmol/L — ABNORMAL LOW (ref 97–108)
Creatinine, Ser: 1.02 mg/dL — ABNORMAL HIGH (ref 0.57–1.00)
GFR calc Af Amer: 58 mL/min/{1.73_m2} — ABNORMAL LOW (ref >59)
GFR, EST NON AFRICAN AMERICAN: 50 mL/min/{1.73_m2} — AB (ref >59)
GLOBULIN, TOTAL: 3.1 g/dL (ref 1.5–4.5)
Glucose: 126 mg/dL — ABNORMAL HIGH (ref 65–99)
POTASSIUM: 4.4 mmol/L (ref 3.5–5.2)
SODIUM: 138 mmol/L (ref 134–144)
Total Protein: 7.4 g/dL (ref 6.0–8.5)

## 2015-01-03 LAB — D-DIMER, QUANTITATIVE (NOT AT ARMC): D-DIMER: 1 mg{FEU}/L — AB (ref 0.00–0.49)

## 2015-01-03 NOTE — Addendum Note (Signed)
Addended by: Wardell Heath on: 01/03/2015 02:51 PM   Modules accepted: Orders

## 2015-01-09 ENCOUNTER — Other Ambulatory Visit (HOSPITAL_COMMUNITY): Payer: Self-pay

## 2015-01-10 ENCOUNTER — Ambulatory Visit (INDEPENDENT_AMBULATORY_CARE_PROVIDER_SITE_OTHER): Payer: Medicare PPO | Admitting: Family Medicine

## 2015-01-10 DIAGNOSIS — F0390 Unspecified dementia without behavioral disturbance: Secondary | ICD-10-CM

## 2015-01-10 DIAGNOSIS — F411 Generalized anxiety disorder: Secondary | ICD-10-CM | POA: Diagnosis not present

## 2015-01-10 DIAGNOSIS — M4806 Spinal stenosis, lumbar region: Secondary | ICD-10-CM | POA: Diagnosis not present

## 2015-01-10 DIAGNOSIS — I10 Essential (primary) hypertension: Secondary | ICD-10-CM | POA: Diagnosis not present

## 2015-01-11 ENCOUNTER — Ambulatory Visit (HOSPITAL_COMMUNITY)
Admission: RE | Admit: 2015-01-11 | Discharge: 2015-01-11 | Disposition: A | Discharge status: Home / Self Care | Payer: Medicare PPO | Source: Ambulatory Visit | Attending: Family Medicine | Admitting: Family Medicine

## 2015-01-11 DIAGNOSIS — R7989 Other specified abnormal findings of blood chemistry: Secondary | ICD-10-CM

## 2015-01-11 MED ORDER — SODIUM CHLORIDE 0.9 % IJ SOLN
INTRAMUSCULAR | Status: AC
Start: 2015-01-11 — End: 2015-01-11
  Filled 2015-01-11: qty 250

## 2015-01-12 ENCOUNTER — Ambulatory Visit: Payer: Self-pay | Admitting: Family Medicine

## 2015-01-24 ENCOUNTER — Ambulatory Visit (INDEPENDENT_AMBULATORY_CARE_PROVIDER_SITE_OTHER): Payer: Medicare PPO | Admitting: Diagnostic Neuroimaging

## 2015-01-24 ENCOUNTER — Encounter: Payer: Self-pay | Admitting: Diagnostic Neuroimaging

## 2015-01-24 VITALS — BP 151/69 | HR 68 | Ht 62.0 in | Wt 96.2 lb

## 2015-01-24 DIAGNOSIS — G40909 Epilepsy, unspecified, not intractable, without status epilepticus: Secondary | ICD-10-CM

## 2015-01-24 DIAGNOSIS — F03B0 Unspecified dementia, moderate, without behavioral disturbance, psychotic disturbance, mood disturbance, and anxiety: Secondary | ICD-10-CM

## 2015-01-24 DIAGNOSIS — F0390 Unspecified dementia without behavioral disturbance: Secondary | ICD-10-CM | POA: Diagnosis not present

## 2015-01-24 MED ORDER — DIVALPROEX SODIUM 250 MG PO DR TAB: 250.0000 mg | DELAYED_RELEASE_TABLET | Freq: Two times a day (BID) | ORAL | Status: DC

## 2015-01-24 NOTE — Progress Notes (Signed)
[facility]  PATIENT: Vicki Gomez DOB: 1926/09/09  REFERRING CLINICIAN: Stacks  HISTORY FROM: patient  REASON FOR VISIT: new consult    HISTORICAL  CHIEF COMPLAINT:  Chief Complaint  Patient presents with  . Alzheimer's Disease    rm 7, New Patient, dgtr Lattie Haw, MMSE     HISTORY OF PRESENT ILLNESS:   81 year old right-handed female here for evaluation of dementia. Patient is severely hard of hearing and limits evaluation. Patient accompanied by daughter for this visit. I reviewed prior hospital notes and referring physician notes.  Patient was admitted to [facility] 12/14/2014 at 12/16/2014 for altered mental status. Patient was moderately dehydrated. Over 24 hours patient's status significant improvement. Patient was diagnosed with possible dementia, exacerbated by metabolic factors. Patient was also found to have significant hypertension on admission. Patient had CT scan of the head which showed no acute findings. Mild to moderate atrophy and small vessel disease were noted. 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  Patient's daughter reports at least 2-3 year history of progressive mild short-term memory loss. Patient also has history of anxiety attacks. She is to be of a hard of hearing. She currently lives alone. She needs help from family members for her activities of daily living. Patient no longer drives.  Also of note patient daughter notes that patient has been having intermittent episodes of whole body stiffness, staring, followed by shaking movements for 3-5 minutes. She's had 3 of these episodes over the last 3 years. She is been taken to the hospital for one of these events. Patient usually has evaluation emergency room is discharged home. No one has ever raise possibility of seizure as an explanation so far.    REVIEW OF SYSTEMS: Full 14 system review of systems performed and notable only for hearing loss  shortness of breath anxiety memory loss confusion.  ALLERGIES: Allergies  Allergen Reactions  . Codeine     Unsure of reaction  . Pneumovax [Pneumococcal Polysaccharide Vaccine]     Unsure of reaction    HOME MEDICATIONS: Outpatient Prescriptions Prior to Visit  Medication Sig Dispense Refill  . DULoxetine (CYMBALTA) 20 MG capsule Take 1 capsule (20 mg total) by mouth daily. 30 capsule 3  . HYDROcodone-acetaminophen (NORCO) 7.5-325 MG per tablet Take 1 tablet by mouth every 6 (six) hours as needed for moderate pain. 90 tablet 0  . lisinopril-hydrochlorothiazide (ZESTORETIC) 20-12.5 MG per tablet Take 2 tablets by mouth daily. 60 tablet 11  . LORazepam (ATIVAN) 0.5 MG tablet TAKE 1 TABLET EVERY 12 TO 24 HOURS AS NEEDED 60 tablet 3  . metoprolol succinate (TOPROL-XL) 25 MG 24 hr tablet Take 1 tablet (25 mg total) by mouth daily. 30 tablet 4  . raloxifene (EVISTA) 60 MG tablet Take 1 tablet (60 mg total) by mouth daily. 30 tablet 11  . Vitamin D, Ergocalciferol, (DRISDOL) 50000 UNITS CAPS capsule Take 1 capsule by mouth once a week.  1  . HYDROcodone-acetaminophen (NORCO) 7.5-325 MG per tablet Take 1 tablet by mouth every 6 (six) hours as needed for moderate pain. 90 tablet 0   No facility-administered medications prior to visit.    PAST MEDICAL HISTORY: Past Medical History  Diagnosis Date  . Hard of hearing   . Hypertension   . Anxiety   . Chronic back pain   . Spinal stenosis of lumbar region   . Vitamin D deficiency   . Breast cancer 1987    PAST SURGICAL HISTORY:  Past Surgical History  Procedure Laterality Date  . Mastectomy Right 1987    FAMILY HISTORY: Family History  Problem Relation Age of Onset  . Stroke Mother   . Stroke Father     unsure  . Stroke Brother     SOCIAL HISTORY:  Social History   Social History  . Marital Status: Widowed    Spouse Name: N/A  . Number of Children: 3  . Years of Education: 7   Occupational History  . 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 Smoking status: Former Research scientist (life sciences)  . Smokeless tobacco: Not on file     Comment: quit years ago  . Alcohol Use: No  . Drug Use: No  . Sexual Activity: Not on file   Other Topics Concern  . Not on file   Social History Narrative   01/24/15 Lives alone, family in often, brother staying overnight at this time   Caffeine use - coffee 2 cups/day     PHYSICAL EXAM  GENERAL EXAM/CONSTITUTIONAL: Vitals:  Filed Vitals:   01/24/15 1112  BP: 151/69  Pulse: 68  Height: 5\' 2"  (1.575 m)  Weight: 96 lb 3.2 oz (43.636 kg)     Body mass index is 17.59 kg/(m^2).  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    Patient is in no distress; well developed, nourished and groomed; neck is supple  CARDIOVASCULAR:  Examination of carotid arteries is normal; no carotid bruits  Regular rate and rhythm, no murmurs  Examination of peripheral vascular system by observation and palpation is normal  EYES:  Ophthalmoscopic exam of optic discs and posterior segments is normal; no papilledema or hemorrhages  MUSCULOSKELETAL:  Gait, strength, tone, movements noted in Neurologic exam below  NEUROLOGIC: MENTAL STATUS:  MMSE - Mini Mental State Exam 01/24/2015  Orientation to time 1  Orientation to Place 2  Registration 3  Attention/ Calculation 4  Recall 0  Language- name 2 objects 2  Language- repeat 0  Language- follow 3 step command 0  Language- read & follow direction 1  Write a sentence 0  Copy design 0  Total score 13    awake, alert, oriented to person; "[place], [place]"  REGISTERS 3/3; RECALLS 0/3  DECR attention and concentration  language fluent, comprehension intact, naming intact,   fund of knowledge appropriate  TESTING LIMITED BY SEVERE HEARING LOSS AND LEFT EYE BLINDNESS  CRANIAL NERVE:   2nd -  no papilledema on fundoscopic exam ON RIGHT EYE  2nd, 3rd, 4th, 6th - RIGHT PUPIL REACTIVE; LEFT PUPIL POST-SURGICAL, DEVIATED TO LEFT SIDE; visual fields full to confrontation IN RIGHT EYE; NO VISION IN LEFT EYE, extraocular muscles intact, no nystagmus  5th - facial sensation symmetric  7th - facial strength symmetric  8th - hearing --> SEVERELY REDUCED  9th - palate elevates symmetrically, uvula midline  11th - shoulder shrug symmetric  12th - tongue protrusion midline  MOTOR:   normal bulk and tone, full strength in the BUE, BLE  SENSORY:   normal and symmetric to light touch, temperature, vibration  COORDINATION:   finger-nose-finger, fine finger movements normal  REFLEXES:   deep tendon reflexes TRACE and symmetric  GAIT/STATION:   narrow based gait; STABLE    DIAGNOSTIC DATA (LABS, IMAGING, TESTING) - I reviewed patient records, labs,  notes, testing and imaging myself where available.  Lab Results  Component Value Date   WBC 6.9 01/01/2015   HGB 14.3 03/24/2013   HCT 42.2 01/01/2015   MCV 91.3 03/24/2013      Component Value Date/Time   NA 138 01/01/2015 1458   NA 140 09/23/2012 1311   K 4.4 01/01/2015 1458   CL 95* 01/01/2015 1458   CO2 30* 01/01/2015 1458   GLUCOSE 126* 01/01/2015 1458   GLUCOSE 92 09/23/2012 1311   BUN 25 01/01/2015 1458   BUN 17 09/23/2012 1311   CREATININE 1.02* 01/01/2015 1458   CREATININE 0.84 09/23/2012 1311   CALCIUM 9.7 01/01/2015 1458   PROT 7.4 01/01/2015 1458   PROT 7.6 09/23/2012 1311   ALBUMIN 4.7 09/23/2012 1311   AST 19 01/01/2015 1458   ALT 12 01/01/2015 1458   ALKPHOS 69 01/01/2015 1458   BILITOT 0.7 01/01/2015 1458   BILITOT 0.5 03/24/2013 1555   GFRNONAA 50* 01/01/2015 1458   GFRNONAA 64 09/23/2012 1311   GFRAA 58* 01/01/2015 1458   GFRAA 74 09/23/2012 1311   Lab Results  Component Value Date   CHOL 276* 09/23/2012   HDL 91 09/23/2012   LDLCALC 172* 09/23/2012   TRIG 63 09/23/2012   Lab  Results  Component Value Date   HGBA1C 6.0 09/23/2012   No results found for: VITAMINB12 Lab Results  Component Value Date   TSH 1.132 09/23/2012      ASSESSMENT AND PLAN  81 y.o. year old female here with progressive short-term memory loss, with acute confusional episode in early August 2016 in the setting of hypertensive crisis and dehydration, most likely representing dementia. Had a long conversation with patient and her daughter about safety and supervision issues.  Also patient has had 3 episodes suspicious for generalized tonic-clonic seizure. Will start empiric antiseizure medication. Seizure disorder probably related to neurodegenerative dementia versus history of stroke.  Dx:  Moderate dementia, without behavioral disturbance  Seizure disorder    PLAN: - discussed with patient and daughter about diagnosis, prognosis and treatment options - I recommend increased safety/supervision for patient; she will need 24 hour supervision - discussed donepezil and memantine, but given age, severity of symptoms and current functional status, I think these would be of limited benefit; will not recommend use of these medications - start depakote 250mg  BID to help prevent seizures - may consider home health agency referral - may consider palliative care consult for advanced care planning and focus on comfort/quality of life  Meds ordered this encounter  Medications  . divalproex (DEPAKOTE) 250 MG DR tablet    Sig: Take 1 tablet (250 mg total) by mouth 2 (two) times daily.    Dispense:  60 tablet    Refill:  12   Return in about 6 months (around 07/24/2015), or if symptoms worsen or fail to improve, for return to PCP.  I reviewed images, labs, notes, records myself. I summarized findings and reviewed with patient, for this high risk condition (seizure disorder, acute delirium on dementia) requiring high complexity decision making.    Penni Bombard, MD 9/83/3825, 05:39  AM Certified in Neurology, Neurophysiology and Neuroimaging  [facility] [address], [place], [place] [address] [phone]

## 2015-01-24 NOTE — Patient Instructions (Signed)
Start divalproex 250mg  twice a day for seizure prevention.  Increase supervision and safety for patient.

## 2015-03-05 ENCOUNTER — Encounter: Payer: Self-pay | Admitting: Family Medicine

## 2015-03-05 ENCOUNTER — Ambulatory Visit (INDEPENDENT_AMBULATORY_CARE_PROVIDER_SITE_OTHER): Payer: Medicare PPO | Admitting: Family Medicine

## 2015-03-05 VITALS — BP 147/60 | HR 89 | Temp 97.3°F | Ht 62.0 in | Wt 96.6 lb

## 2015-03-05 DIAGNOSIS — F1120 Opioid dependence, uncomplicated: Secondary | ICD-10-CM

## 2015-03-05 DIAGNOSIS — M4806 Spinal stenosis, lumbar region: Secondary | ICD-10-CM | POA: Diagnosis not present

## 2015-03-05 DIAGNOSIS — M48061 Spinal stenosis, lumbar region without neurogenic claudication: Secondary | ICD-10-CM

## 2015-03-05 DIAGNOSIS — F0390 Unspecified dementia without behavioral disturbance: Secondary | ICD-10-CM | POA: Diagnosis not present

## 2015-03-05 DIAGNOSIS — E559 Vitamin D deficiency, unspecified: Secondary | ICD-10-CM | POA: Diagnosis not present

## 2015-03-05 DIAGNOSIS — I10 Essential (primary) hypertension: Secondary | ICD-10-CM

## 2015-03-05 DIAGNOSIS — Z23 Encounter for immunization: Secondary | ICD-10-CM

## 2015-03-05 MED ORDER — HYDROCODONE-ACETAMINOPHEN 10-325 MG PO TABS: 1.0000 | ORAL_TABLET | Freq: Four times a day (QID) | ORAL | Status: DC | PRN

## 2015-03-05 NOTE — Progress Notes (Signed)
Subjective:  Patient ID: Vicki Gomez, female    DOB: Oct 01, 1926  Age: 81 y.o. MRN: [id number]  CC: Hypertension; Back Pain; and Altzheimer's   HPI Vicki Gomez presents for back pain. Unable to function, just sits. Hurts a lot. Daughter feels increased med would improve function. She can get up and move around the house some and help with transfers and ADLs when not in intense pain. INadequate relief with hydro, but it does help. She just sits and stares and complains of how bad her back hurts. She moans a lot. She is only minimally communicative usually. This is due to her dementia.  History Vicki Gomez has a past medical history of Hard of hearing; Hypertension; Anxiety; Chronic back pain; Spinal stenosis of lumbar region; Vitamin D deficiency; and Breast cancer (HCC) (1987).   She has past surgical history that includes Mastectomy (Right, 1987).   Her family history includes Stroke in her brother, father, and mother.She reports that she has quit smoking. She does not have any smokeless tobacco history on file. She reports that she does not drink alcohol or use illicit drugs.  Outpatient Prescriptions Prior to Visit  Medication Sig Dispense Refill  . divalproex (DEPAKOTE) 250 MG DR tablet Take 1 tablet (250 mg total) by mouth 2 (two) times daily. 60 tablet 12  . DULoxetine (CYMBALTA) 20 MG capsule Take 1 capsule (20 mg total) by mouth daily. 30 capsule 3  . lisinopril-hydrochlorothiazide (ZESTORETIC) 20-12.5 MG per tablet Take 2 tablets by mouth daily. 60 tablet 11  . LORazepam (ATIVAN) 0.5 MG tablet TAKE 1 TABLET EVERY 12 TO 24 HOURS AS NEEDED 60 tablet 3  . metoprolol succinate (TOPROL-XL) 25 MG 24 hr tablet Take 1 tablet (25 mg total) by mouth daily. 30 tablet 4  . raloxifene (EVISTA) 60 MG tablet Take 1 tablet (60 mg total) by mouth daily. 30 tablet 11  . Vitamin D, Ergocalciferol, (DRISDOL) 50000 UNITS CAPS capsule Take 1 capsule by mouth once a week.  1  . HYDROcodone-acetaminophen  (NORCO) 7.5-325 MG per tablet Take 1 tablet by mouth every 6 (six) hours as needed for moderate pain. 90 tablet 0   No facility-administered medications prior to visit.    ROS Review of Systems  Constitutional: Negative for fever, chills, diaphoresis, appetite change and unexpected weight change.  HENT: Negative for congestion, ear pain, hearing loss, postnasal drip, rhinorrhea, sneezing, sore throat and trouble swallowing.   Eyes: Negative for pain.  Respiratory: Negative for cough, chest tightness and shortness of breath.   Cardiovascular: Negative for chest pain and palpitations.  Gastrointestinal: Negative for nausea, vomiting, abdominal pain, diarrhea and constipation.  Genitourinary: Negative for dysuria, frequency and menstrual problem.  Musculoskeletal: Positive for back pain, arthralgias and gait problem. Negative for joint swelling.  Skin: Negative for rash.  Neurological: Positive for weakness. Negative for dizziness, numbness and headaches.  Psychiatric/Behavioral: Negative for dysphoric mood and agitation.    Objective:  BP 147/60 mmHg  Pulse 89  Temp(Src) 97.3 F (36.3 C) (Oral)  Ht 5\' 2"  (1.575 m)  Wt 96 lb 9.6 oz (43.817 kg)  BMI 17.66 kg/m2  BP Readings from Last 3 Encounters:  03/05/15 147/60  01/24/15 151/69  01/01/15 127/54    Wt Readings from Last 3 Encounters:  03/05/15 96 lb 9.6 oz (43.817 kg)  01/24/15 96 lb 3.2 oz (43.636 kg)  01/01/15 96 lb 9.6 oz (43.817 kg)     Physical Exam  Constitutional: She appears well-developed. No distress.  HENT:  Head:  Normocephalic and atraumatic.  Right Ear: External ear normal.  Left Ear: External ear normal.  Nose: Nose normal.  Mouth/Throat: Oropharynx is clear and moist.  Eyes: Conjunctivae and EOM are normal. Pupils are equal, round, and reactive to light.  Neck: Normal range of motion. Neck supple. No thyromegaly present.  Cardiovascular: Normal rate, regular rhythm and normal heart sounds.   No murmur  heard. Pulmonary/Chest: Effort normal and breath sounds normal. No respiratory distress. She has no wheezes. She has no rales.  Abdominal: Soft. Bowel sounds are normal. She exhibits no distension. There is no tenderness.  Musculoskeletal: Normal range of motion. She exhibits no edema or tenderness.  Lymphadenopathy:    She has no cervical adenopathy.  Neurological: She has normal reflexes. No cranial nerve deficit. Coordination normal.  Skin: Skin is warm and dry.  Psychiatric: She is slowed and withdrawn. Cognition and memory are impaired. She is communicative. She is inattentive.    Lab Results  Component Value Date   HGBA1C 6.0 09/23/2012    Lab Results  Component Value Date   WBC 6.9 01/01/2015   HGB 14.3 03/24/2013   HCT 42.2 01/01/2015   GLUCOSE 104* 03/05/2015   CHOL 276* 09/23/2012   TRIG 63 09/23/2012   HDL 91 09/23/2012   LDLCALC 172* 09/23/2012   ALT 10 03/05/2015   AST 16 03/05/2015   NA 143 03/05/2015   K 4.5 03/05/2015   CL 99 03/05/2015   CREATININE 1.01* 03/05/2015   BUN 24 03/05/2015   CO2 26 03/05/2015   TSH 1.132 09/23/2012   HGBA1C 6.0 09/23/2012    No results found.  Assessment & Plan:   Vicki Gomez was seen today for hypertension, back pain and altzheimer's.  Diagnoses and all orders for this visit:  Spinal stenosis of lumbar region -     CMP14+EGFR -     Valproic acid level -     Vit D  25 hydroxy (rtn osteoporosis monitoring) -     ToxASSURE Select 13 (MW), Urine  Encounter for immunization  Dementia, without behavioral disturbance -     CMP14+EGFR -     Valproic acid level -     Vit D  25 hydroxy (rtn osteoporosis monitoring) -     ToxASSURE Select 13 (MW), Urine  Essential hypertension -     CMP14+EGFR -     Valproic acid level -     Vit D  25 hydroxy (rtn osteoporosis monitoring) -     ToxASSURE Select 13 (MW), Urine  Vitamin D deficiency -     CMP14+EGFR -     Valproic acid level -     Vit D  25 hydroxy (rtn osteoporosis  monitoring) -     ToxASSURE Select 13 (MW), Urine  Opiate dependence, continuous (HCC) -     CMP14+EGFR -     Valproic acid level -     Vit D  25 hydroxy (rtn osteoporosis monitoring) -     ToxASSURE Select 13 (MW), Urine  Other orders -     Flu Vaccine QUAD 36+ mos IM -     HYDROcodone-acetaminophen (NORCO) 10-325 MG tablet; Take 1 tablet by mouth every 6 (six) hours as needed for moderate pain. -     HYDROcodone-acetaminophen (NORCO) 10-325 MG tablet; Take 1 tablet by mouth every 6 (six) hours as needed. -     HYDROcodone-acetaminophen (NORCO) 10-325 MG tablet; Take 1 tablet by mouth every 6 (six) hours as needed.   I have  discontinued Vicki Gomez's HYDROcodone-acetaminophen. I am also having her start on HYDROcodone-acetaminophen, HYDROcodone-acetaminophen, and HYDROcodone-acetaminophen. Additionally, I am having her maintain her LORazepam, lisinopril-hydrochlorothiazide, raloxifene, metoprolol succinate, Vitamin D (Ergocalciferol), DULoxetine, and divalproex.  Meds ordered this encounter  Medications  . HYDROcodone-acetaminophen (NORCO) 10-325 MG tablet    Sig: Take 1 tablet by mouth every 6 (six) hours as needed for moderate pain.    Dispense:  120 tablet    Refill:  0  . HYDROcodone-acetaminophen (NORCO) 10-325 MG tablet    Sig: Take 1 tablet by mouth every 6 (six) hours as needed.    Dispense:  30 tablet    Refill:  0    Do not fill until 04/04/2015  . HYDROcodone-acetaminophen (NORCO) 10-325 MG tablet    Sig: Take 1 tablet by mouth every 6 (six) hours as needed.    Dispense:  120 tablet    Refill:  0    Do not fill until 05/04/2015     Follow-up: Return in about 3 months (around 06/05/2015).  Claretta Fraise, M.D.

## 2015-03-06 LAB — CMP14+EGFR
ALK PHOS: 61 IU/L (ref 39–117)
ALT: 10 IU/L (ref 0–32)
AST: 16 IU/L (ref 0–40)
Albumin/Globulin Ratio: 1.4 (ref 1.1–2.5)
Albumin: 4.1 g/dL (ref 3.5–4.7)
BUN/Creatinine Ratio: 24 (ref 11–26)
BUN: 24 mg/dL (ref 8–27)
Bilirubin Total: 0.5 mg/dL (ref 0.0–1.2)
CALCIUM: 9.5 mg/dL (ref 8.7–10.3)
CO2: 26 mmol/L (ref 18–29)
CREATININE: 1.01 mg/dL — AB (ref 0.57–1.00)
Chloride: 99 mmol/L (ref 97–106)
GFR calc Af Amer: 58 mL/min/{1.73_m2} — ABNORMAL LOW (ref >59)
GFR, EST NON AFRICAN AMERICAN: 51 mL/min/{1.73_m2} — AB (ref >59)
GLOBULIN, TOTAL: 3 g/dL (ref 1.5–4.5)
GLUCOSE: 104 mg/dL — AB (ref 65–99)
Potassium: 4.5 mmol/L (ref 3.5–5.2)
SODIUM: 143 mmol/L (ref 136–144)
Total Protein: 7.1 g/dL (ref 6.0–8.5)

## 2015-03-06 LAB — VALPROIC ACID LEVEL: VALPROIC ACID LVL: 5 ug/mL — AB (ref 50–100)

## 2015-03-06 LAB — VITAMIN D 25 HYDROXY (VIT D DEFICIENCY, FRACTURES): Vit D, 25-Hydroxy: 40.7 ng/mL (ref 30.0–100.0)

## 2015-03-11 LAB — TOXASSURE SELECT 13 (MW), URINE: PDF: 0

## 2015-03-26 ENCOUNTER — Ambulatory Visit: Payer: Self-pay | Admitting: Family Medicine

## 2015-03-27 ENCOUNTER — Encounter: Payer: Self-pay | Admitting: Family Medicine

## 2015-04-25 ENCOUNTER — Other Ambulatory Visit: Payer: Self-pay | Admitting: Family Medicine

## 2015-05-14 ENCOUNTER — Other Ambulatory Visit: Payer: Self-pay | Admitting: Family Medicine

## 2015-05-15 NOTE — Telephone Encounter (Signed)
Patient last seen in office on 03-05-15. Rx last filled on 01-26-15 for #[id number]. Please advise. If approved please route to Pool B so nurse can phone in to pharmacy

## 2015-05-16 NOTE — Telephone Encounter (Signed)
Script called to vm at [facility]. 

## 2015-06-06 ENCOUNTER — Encounter: Payer: Self-pay | Admitting: Family Medicine

## 2015-06-06 ENCOUNTER — Ambulatory Visit (INDEPENDENT_AMBULATORY_CARE_PROVIDER_SITE_OTHER): Payer: Medicare PPO | Admitting: Family Medicine

## 2015-06-06 VITALS — BP 132/60 | HR 67 | Temp 97.4°F | Ht 62.0 in | Wt 97.2 lb

## 2015-06-06 DIAGNOSIS — F411 Generalized anxiety disorder: Secondary | ICD-10-CM

## 2015-06-06 DIAGNOSIS — H9193 Unspecified hearing loss, bilateral: Secondary | ICD-10-CM

## 2015-06-06 DIAGNOSIS — G8929 Other chronic pain: Secondary | ICD-10-CM

## 2015-06-06 DIAGNOSIS — G308 Other Alzheimer's disease: Secondary | ICD-10-CM | POA: Diagnosis not present

## 2015-06-06 DIAGNOSIS — F0280 Dementia in other diseases classified elsewhere without behavioral disturbance: Secondary | ICD-10-CM | POA: Diagnosis not present

## 2015-06-06 DIAGNOSIS — F1120 Opioid dependence, uncomplicated: Secondary | ICD-10-CM

## 2015-06-06 DIAGNOSIS — I10 Essential (primary) hypertension: Secondary | ICD-10-CM

## 2015-06-06 DIAGNOSIS — M545 Low back pain: Secondary | ICD-10-CM

## 2015-06-06 MED ORDER — METOPROLOL SUCCINATE ER 25 MG PO TB24: 25.0000 mg | ORAL_TABLET | Freq: Every day | ORAL | Status: DC

## 2015-06-06 MED ORDER — HYDROCODONE-ACETAMINOPHEN 10-325 MG PO TABS: 1.0000 | ORAL_TABLET | Freq: Four times a day (QID) | ORAL | Status: DC | PRN

## 2015-06-06 MED ORDER — LORAZEPAM 0.5 MG PO TABS: ORAL_TABLET | ORAL | Status: DC

## 2015-06-06 MED ORDER — DULOXETINE HCL 20 MG PO CPEP: ORAL_CAPSULE | ORAL | Status: DC

## 2015-06-06 MED ORDER — RALOXIFENE HCL 60 MG PO TABS: 60.0000 mg | ORAL_TABLET | Freq: Every day | ORAL | Status: DC

## 2015-06-06 NOTE — Progress Notes (Signed)
Subjective:  Patient ID: Vicki Gomez, female    DOB: [birth year]  Age: 82 y.o. MRN: [id number]  CC: Hypertension; Dementia; and Pain   HPI Vicki Gomez presents for  follow-up of hypertension. Patient has no history of headache chest pain or shortness of breath or recent cough. Patient also denies symptoms of TIA such as numbness weakness lateralizing. Patient checks  blood pressure at home and has not had any elevated readings recently. Patient denies side effects from medication. States taking it regularly.  Daughter feels mental status is stable. Pt. Communicates minimally other than to complain of pain. Pain is primarily in the lower back.    History Vicki Gomez has a past medical history of Hard of hearing; Hypertension; Anxiety; Chronic back pain; Spinal stenosis of lumbar region; Vitamin D deficiency; and Breast cancer ([place]) (1987).   She has past surgical history that includes Mastectomy (Right, 1987).   Her family history includes Stroke in her brother, father, and mother.She reports that she has quit smoking. She does not have any smokeless tobacco history on file. She reports that she does not drink alcohol or use illicit drugs.  Current Outpatient Prescriptions on File Prior to Visit  Medication Sig Dispense Refill  . divalproex (DEPAKOTE) 250 MG DR tablet Take 1 tablet (250 mg total) by mouth 2 (two) times daily. 60 tablet 12  . lisinopril-hydrochlorothiazide (ZESTORETIC) 20-12.5 MG per tablet Take 2 tablets by mouth daily. 60 tablet 11  . Vitamin D, Ergocalciferol, (DRISDOL) 50000 UNITS CAPS capsule Take 1 capsule by mouth once a week.  1   No current facility-administered medications on file prior to visit.    ROS Review of Systems  Constitutional: Negative for fever, activity change and appetite change.  HENT: Negative for congestion, rhinorrhea and sore throat.   Eyes: Negative for visual disturbance.  Respiratory: Negative for cough and shortness of breath.     Cardiovascular: Negative for chest pain and palpitations.  Gastrointestinal: Negative for nausea, abdominal pain and diarrhea.  Genitourinary: Negative for dysuria.  Musculoskeletal: Negative for myalgias and arthralgias.    Objective:  BP 132/60 mmHg  Pulse 67  Temp(Src) 97.4 F (36.3 C) (Oral)  Ht 5\' 2"  (1.575 m)  Wt 97 lb 3.2 oz (44.09 kg)  BMI 17.77 kg/m2  SpO2 99%  BP Readings from Last 3 Encounters:  06/06/15 132/60  03/05/15 147/60  01/24/15 151/69    Wt Readings from Last 3 Encounters:  06/06/15 97 lb 3.2 oz (44.09 kg)  03/05/15 96 lb 9.6 oz (43.817 kg)  01/24/15 96 lb 3.2 oz (43.636 kg)     Physical Exam  Constitutional: She is oriented to person, place, and time. She appears well-developed and well-nourished. No distress.  HENT:  Head: Normocephalic and atraumatic.  Eyes: Conjunctivae are normal. Pupils are equal, round, and reactive to light.  Neck: Normal range of motion. Neck supple. No thyromegaly present.  Cardiovascular: Normal rate, regular rhythm and normal heart sounds.   No murmur heard. Pulmonary/Chest: Effort normal and breath sounds normal. No respiratory distress. She has no wheezes. She has no rales.  Abdominal: Soft. Bowel sounds are normal. She exhibits no distension. There is no tenderness.  Musculoskeletal: Normal range of motion.  Lymphadenopathy:    She has no cervical adenopathy.  Neurological: She is alert and oriented to person, place, and time.  Skin: Skin is warm and dry.  Psychiatric: Her behavior is normal. Her affect is blunt. Cognition and memory are impaired. She is noncommunicative.  Lab Results  Component Value Date   WBC 6.9 01/01/2015   HGB 14.3 03/24/2013   HCT 42.2 01/01/2015   GLUCOSE 104* 03/05/2015   CHOL 276* 09/23/2012   TRIG 63 09/23/2012   HDL 91 09/23/2012   LDLCALC 172* 09/23/2012   ALT 10 03/05/2015   AST 16 03/05/2015   NA 143 03/05/2015   K 4.5 03/05/2015   CL 99 03/05/2015   CREATININE  1.01* 03/05/2015   BUN 24 03/05/2015   CO2 26 03/05/2015   TSH 1.132 09/23/2012   HGBA1C 6.0 09/23/2012    No results found.  Assessment & Plan:   Vicki Gomez was seen today for hypertension, dementia and pain.  Diagnoses and all orders for this visit:  Essential hypertension -     metoprolol succinate (TOPROL-XL) 25 MG 24 hr tablet; Take 1 tablet (25 mg total) by mouth daily.  Chronic low back pain  GAD (generalized anxiety disorder)  Opiate dependence, continuous (HCC)  Hard of hearing, bilateral  Alzheimer's disease of other onset without behavioral disturbance  Other orders -     HYDROcodone-acetaminophen (NORCO) 10-325 MG tablet; Take 1 tablet by mouth every 6 (six) hours as needed for moderate pain. -     HYDROcodone-acetaminophen (NORCO) 10-325 MG tablet; Take 1 tablet by mouth every 6 (six) hours as needed. -     HYDROcodone-acetaminophen (NORCO) 10-325 MG tablet; Take 1 tablet by mouth every 6 (six) hours as needed. -     LORazepam (ATIVAN) 0.5 MG tablet; TAKE 1 TABLET EVERY 12 TO 24 HOURS AS NEEDED -     raloxifene (EVISTA) 60 MG tablet; Take 1 tablet (60 mg total) by mouth daily. -     DULoxetine (CYMBALTA) 20 MG capsule; TAKE 1 CAPSULE (20 MG TOTAL) BY MOUTH DAILY.  I am having Vicki Gomez maintain her lisinopril-hydrochlorothiazide, Vitamin D (Ergocalciferol), divalproex, HYDROcodone-acetaminophen, HYDROcodone-acetaminophen, HYDROcodone-acetaminophen, LORazepam, metoprolol succinate, raloxifene, and DULoxetine.  Meds ordered this encounter  Medications  . HYDROcodone-acetaminophen (NORCO) 10-325 MG tablet    Sig: Take 1 tablet by mouth every 6 (six) hours as needed for moderate pain.    Dispense:  120 tablet    Refill:  0  . HYDROcodone-acetaminophen (NORCO) 10-325 MG tablet    Sig: Take 1 tablet by mouth every 6 (six) hours as needed.    Dispense:  120 tablet    Refill:  0    Do not fill until 07/06/2015  . HYDROcodone-acetaminophen (NORCO) 10-325 MG tablet     Sig: Take 1 tablet by mouth every 6 (six) hours as needed.    Dispense:  120 tablet    Refill:  0    Do not fill until 08/05/2015  . LORazepam (ATIVAN) 0.5 MG tablet    Sig: TAKE 1 TABLET EVERY 12 TO 24 HOURS AS NEEDED    Dispense:  60 tablet    Refill:  5    This request is for a new prescription for a controlled substance as required by Federal/State law..  . metoprolol succinate (TOPROL-XL) 25 MG 24 hr tablet    Sig: Take 1 tablet (25 mg total) by mouth daily.    Dispense:  30 tablet    Refill:  4  . raloxifene (EVISTA) 60 MG tablet    Sig: Take 1 tablet (60 mg total) by mouth daily.    Dispense:  30 tablet    Refill:  11  . DULoxetine (CYMBALTA) 20 MG capsule    Sig: TAKE 1 CAPSULE (20 MG TOTAL)  BY MOUTH DAILY.    Dispense:  30 capsule    Refill:  5    Physical  Follow-up: Return in about 3 months (around 09/04/2015) for Pain.  Claretta Fraise, M.D.

## 2015-06-20 ENCOUNTER — Other Ambulatory Visit: Payer: Self-pay | Admitting: Family Medicine

## 2015-06-21 NOTE — Telephone Encounter (Signed)
Last seen 06/06/15 Dr Livia Snellen   Last Vit D 03/05/15  40.7

## 2015-07-24 ENCOUNTER — Ambulatory Visit: Payer: Self-pay | Admitting: Diagnostic Neuroimaging

## 2015-07-25 ENCOUNTER — Encounter: Payer: Self-pay | Admitting: Diagnostic Neuroimaging

## 2015-08-26 ENCOUNTER — Other Ambulatory Visit: Payer: Self-pay | Admitting: Family Medicine

## 2015-09-05 ENCOUNTER — Encounter: Payer: Self-pay | Admitting: Family Medicine

## 2015-09-05 ENCOUNTER — Ambulatory Visit (INDEPENDENT_AMBULATORY_CARE_PROVIDER_SITE_OTHER): Payer: Medicare PPO | Admitting: Family Medicine

## 2015-09-05 VITALS — Temp 97.3°F | Ht 62.0 in | Wt 102.8 lb

## 2015-09-05 DIAGNOSIS — M545 Low back pain: Secondary | ICD-10-CM | POA: Diagnosis not present

## 2015-09-05 DIAGNOSIS — H9193 Unspecified hearing loss, bilateral: Secondary | ICD-10-CM

## 2015-09-05 DIAGNOSIS — F411 Generalized anxiety disorder: Secondary | ICD-10-CM | POA: Diagnosis not present

## 2015-09-05 DIAGNOSIS — F0280 Dementia in other diseases classified elsewhere without behavioral disturbance: Secondary | ICD-10-CM | POA: Diagnosis not present

## 2015-09-05 DIAGNOSIS — G8929 Other chronic pain: Secondary | ICD-10-CM

## 2015-09-05 DIAGNOSIS — F1120 Opioid dependence, uncomplicated: Secondary | ICD-10-CM | POA: Diagnosis not present

## 2015-09-05 DIAGNOSIS — I10 Essential (primary) hypertension: Secondary | ICD-10-CM | POA: Diagnosis not present

## 2015-09-05 DIAGNOSIS — H6123 Impacted cerumen, bilateral: Secondary | ICD-10-CM | POA: Diagnosis not present

## 2015-09-05 DIAGNOSIS — G309 Alzheimer's disease, unspecified: Secondary | ICD-10-CM

## 2015-09-05 DIAGNOSIS — G308 Other Alzheimer's disease: Secondary | ICD-10-CM | POA: Diagnosis not present

## 2015-09-05 MED ORDER — LORAZEPAM 0.5 MG PO TABS: ORAL_TABLET | ORAL | Status: AC

## 2015-09-05 MED ORDER — HYDROCODONE-ACETAMINOPHEN 10-325 MG PO TABS: 1.0000 | ORAL_TABLET | Freq: Four times a day (QID) | ORAL | Status: DC | PRN

## 2015-09-05 NOTE — Progress Notes (Signed)
Subjective:  Patient ID: Vicki Gomez, female    DOB: 04/21/1927  Age: 82 y.o. MRN: [id number]  CC: Hypertension; Back Pain; and GAD   HPI Vicki Gomez presents for  follow-up of hypertension. Patient has no history of headache chest pain or shortness of breath or recent cough. Patient also denies symptoms of TIA such as numbness weakness lateralizing.  Pt. Demented. States I hurt in my back. Not answering questions. Caretaker concerned for hearing.  GAD 7 : Generalized Anxiety Score 09/05/2015  Nervous, Anxious, on Edge 1  Worry too much - different things 1  Trouble relaxing 0  Restless 0  Easily annoyed or irritable 0  Afraid - awful might happen 1  Anxiety Difficulty Not difficult at all        History Vicki Gomez has a past medical history of Hard of hearing; Hypertension; Anxiety; Chronic back pain; Spinal stenosis of lumbar region; Vitamin D deficiency; and Breast cancer ([place]) (1987).   She has past surgical history that includes Mastectomy (Right, 1987).   Her family history includes Stroke in her brother, father, and mother.She reports that she has quit smoking. She does not have any smokeless tobacco history on file. She reports that she does not drink alcohol or use illicit drugs.  Current Outpatient Prescriptions on File Prior to Visit  Medication Sig Dispense Refill  . divalproex (DEPAKOTE) 250 MG DR tablet Take 1 tablet (250 mg total) by mouth 2 (two) times daily. 60 tablet 12  . DULoxetine (CYMBALTA) 20 MG capsule TAKE 1 CAPSULE (20 MG TOTAL) BY MOUTH DAILY. 30 capsule 5  . lisinopril-hydrochlorothiazide (PRINZIDE,ZESTORETIC) 20-12.5 MG tablet TAKE 2 TABLETS BY MOUTH DAILY. 60 tablet 2  . metoprolol succinate (TOPROL-XL) 25 MG 24 hr tablet Take 1 tablet (25 mg total) by mouth daily. 30 tablet 4  . raloxifene (EVISTA) 60 MG tablet Take 1 tablet (60 mg total) by mouth daily. 30 tablet 11  . Vitamin D, Ergocalciferol, (DRISDOL) 50000 units CAPS capsule TALE 1  CAPSULE BY MOUTH WEEKLY 12 capsule 0   No current facility-administered medications on file prior to visit.    ROS Review of Systems  Constitutional: Negative for fever, activity change and appetite change.  HENT: Positive for hearing loss. Negative for congestion, ear pain, rhinorrhea and sore throat.   Eyes: Negative for visual disturbance.  Respiratory: Negative for cough and shortness of breath.   Cardiovascular: Negative for chest pain and palpitations.  Gastrointestinal: Negative for nausea, abdominal pain and diarrhea.  Genitourinary: Negative for dysuria.  Musculoskeletal: Positive for myalgias and back pain.    Objective:  Temp(Src) 97.3 F (36.3 C) (Oral)  Ht '5\' 2"'$  (1.575 m)  Wt 102 lb 12.8 oz (46.63 kg)  BMI 18.80 kg/m2  BP Readings from Last 3 Encounters:  06/06/15 132/60  03/05/15 147/60  01/24/15 151/69    Wt Readings from Last 3 Encounters:  09/05/15 102 lb 12.8 oz (46.63 kg)  06/06/15 97 lb 3.2 oz (44.09 kg)  03/05/15 96 lb 9.6 oz (43.817 kg)     Physical Exam  Constitutional: She is oriented to person, place, and time. She appears well-developed and well-nourished.  Frail   HENT:  Head: Normocephalic and atraumatic.  Right Ear: External ear normal.  Left Ear: External ear normal.  Nose: Nose normal.  Mouth/Throat: Oropharynx is clear and moist.  Bilateral cerumen impaction  Eyes: Conjunctivae and EOM are normal. Pupils are equal, round, and reactive to light.  Neck: Normal range of motion. Neck supple. No  thyromegaly present.  Cardiovascular: Normal rate, regular rhythm and normal heart sounds.   No murmur heard. Pulmonary/Chest: Effort normal and breath sounds normal. No respiratory distress. She has no wheezes. She has no rales.  Abdominal: Soft. Bowel sounds are normal. She exhibits no distension. There is no tenderness.  Lymphadenopathy:    She has no cervical adenopathy.  Neurological: She is alert and oriented to person, place, and time.  She has normal reflexes.  Skin: Skin is warm and dry.  Psychiatric: She has a normal mood and affect. Her behavior is normal. Judgment and thought content normal.     Lab Results  Component Value Date   WBC 6.4 09/05/2015   HGB 14.3 03/24/2013   HCT 37.3 09/05/2015   PLT 238 09/05/2015   GLUCOSE 85 09/05/2015   CHOL 276* 09/23/2012   TRIG 63 09/23/2012   HDL 91 09/23/2012   LDLCALC 172* 09/23/2012   ALT 9 09/05/2015   AST 17 09/05/2015   NA 143 09/05/2015   K 4.5 09/05/2015   CL 99 09/05/2015   CREATININE 1.04* 09/05/2015   BUN 23 09/05/2015   CO2 27 09/05/2015   TSH 1.132 09/23/2012   HGBA1C 6.0 09/23/2012    No results found.  Assessment & Plan:   Vicki Gomez was seen today for hypertension, back pain and gad.  Diagnoses and all orders for this visit:  Chronic low back pain -     ToxASSURE Select 13 (MW), Urine  Essential hypertension -     CBC with Differential/Platelet -     CMP14+EGFR  GAD (generalized anxiety disorder)  Opiate dependence, continuous (HCC) -     ToxASSURE Select 13 (MW), Urine  Hard of hearing, bilateral  Alzheimer's disease of other onset without behavioral disturbance  Cerumen impaction, bilateral  Other orders -     HYDROcodone-acetaminophen (NORCO) 10-325 MG tablet; Take 1 tablet by mouth every 6 (six) hours as needed for moderate pain. -     HYDROcodone-acetaminophen (NORCO) 10-325 MG tablet; Take 1 tablet by mouth every 6 (six) hours as needed. -     HYDROcodone-acetaminophen (NORCO) 10-325 MG tablet; Take 1 tablet by mouth every 6 (six) hours as needed. -     LORazepam (ATIVAN) 0.5 MG tablet; TAKE 1 TABLET EVERY 12 TO 24 HOURS AS NEEDED   I am having Vicki Gomez maintain her divalproex, metoprolol succinate, raloxifene, DULoxetine, Vitamin D (Ergocalciferol), lisinopril-hydrochlorothiazide, HYDROcodone-acetaminophen, HYDROcodone-acetaminophen, HYDROcodone-acetaminophen, and LORazepam.  Meds ordered this encounter  Medications    . HYDROcodone-acetaminophen (NORCO) 10-325 MG tablet    Sig: Take 1 tablet by mouth every 6 (six) hours as needed for moderate pain.    Dispense:  120 tablet    Refill:  0  . HYDROcodone-acetaminophen (NORCO) 10-325 MG tablet    Sig: Take 1 tablet by mouth every 6 (six) hours as needed.    Dispense:  120 tablet    Refill:  0    Do not fill until 11/04/2015  . HYDROcodone-acetaminophen (NORCO) 10-325 MG tablet    Sig: Take 1 tablet by mouth every 6 (six) hours as needed.    Dispense:  120 tablet    Refill:  0    Do not fill until 10/05/2015  . LORazepam (ATIVAN) 0.5 MG tablet    Sig: TAKE 1 TABLET EVERY 12 TO 24 HOURS AS NEEDED    Dispense:  60 tablet    Refill:  5    Follow-up: Return in about 3 months (around 12/05/2015) for Pain, hypertension.  Tahesha Skeet, M.D.  

## 2015-09-06 LAB — CMP14+EGFR
A/G RATIO: 1.4 (ref 1.2–2.2)
ALK PHOS: 49 IU/L (ref 39–117)
ALT: 9 IU/L (ref 0–32)
AST: 17 IU/L (ref 0–40)
Albumin: 3.9 g/dL (ref 3.5–4.7)
BUN / CREAT RATIO: 22 (ref 12–28)
BUN: 23 mg/dL (ref 8–27)
Bilirubin Total: 0.4 mg/dL (ref 0.0–1.2)
CALCIUM: 9 mg/dL (ref 8.7–10.3)
CO2: 27 mmol/L (ref 18–29)
Chloride: 99 mmol/L (ref 96–106)
Creatinine, Ser: 1.04 mg/dL — ABNORMAL HIGH (ref 0.57–1.00)
GFR calc Af Amer: 56 mL/min/{1.73_m2} — ABNORMAL LOW (ref >59)
GFR, EST NON AFRICAN AMERICAN: 49 mL/min/{1.73_m2} — AB (ref >59)
GLOBULIN, TOTAL: 2.7 g/dL (ref 1.5–4.5)
Glucose: 85 mg/dL (ref 65–99)
POTASSIUM: 4.5 mmol/L (ref 3.5–5.2)
SODIUM: 143 mmol/L (ref 134–144)
Total Protein: 6.6 g/dL (ref 6.0–8.5)

## 2015-09-06 LAB — CBC WITH DIFFERENTIAL/PLATELET
BASOS: 1 %
Basophils Absolute: 0 10*3/uL (ref 0.0–0.2)
EOS (ABSOLUTE): 0.1 10*3/uL (ref 0.0–0.4)
EOS: 2 %
HEMATOCRIT: 37.3 % (ref 34.0–46.6)
Hemoglobin: 12.3 g/dL (ref 11.1–15.9)
Immature Grans (Abs): 0 10*3/uL (ref 0.0–0.1)
Immature Granulocytes: 0 %
LYMPHS ABS: 2.6 10*3/uL (ref 0.7–3.1)
Lymphs: 41 %
MCH: 31.1 pg (ref 26.6–33.0)
MCHC: 33 g/dL (ref 31.5–35.7)
MCV: 94 fL (ref 79–97)
MONOS ABS: 0.9 10*3/uL (ref 0.1–0.9)
Monocytes: 13 %
NEUTROS ABS: 2.7 10*3/uL (ref 1.4–7.0)
Neutrophils: 43 %
Platelets: 238 10*3/uL (ref 150–379)
RBC: 3.96 x10E6/uL (ref 3.77–5.28)
RDW: 13.3 % (ref 12.3–15.4)
WBC: 6.4 10*3/uL (ref 3.4–10.8)

## 2015-09-11 LAB — TOXASSURE SELECT 13 (MW), URINE: PDF: 0

## 2015-12-02 ENCOUNTER — Other Ambulatory Visit: Payer: Self-pay | Admitting: Family Medicine

## 2015-12-02 DIAGNOSIS — I10 Essential (primary) hypertension: Secondary | ICD-10-CM

## 2015-12-07 ENCOUNTER — Ambulatory Visit (INDEPENDENT_AMBULATORY_CARE_PROVIDER_SITE_OTHER): Payer: Medicare PPO

## 2015-12-07 ENCOUNTER — Ambulatory Visit (INDEPENDENT_AMBULATORY_CARE_PROVIDER_SITE_OTHER): Payer: Medicare PPO | Admitting: Family Medicine

## 2015-12-07 ENCOUNTER — Encounter: Payer: Self-pay | Admitting: Family Medicine

## 2015-12-07 VITALS — BP 99/49 | HR 68 | Temp 97.7°F | Ht 62.0 in | Wt 94.8 lb

## 2015-12-07 DIAGNOSIS — G308 Other Alzheimer's disease: Secondary | ICD-10-CM

## 2015-12-07 DIAGNOSIS — G8929 Other chronic pain: Secondary | ICD-10-CM | POA: Diagnosis not present

## 2015-12-07 DIAGNOSIS — F0281 Dementia in other diseases classified elsewhere with behavioral disturbance: Secondary | ICD-10-CM | POA: Diagnosis not present

## 2015-12-07 DIAGNOSIS — I10 Essential (primary) hypertension: Secondary | ICD-10-CM

## 2015-12-07 DIAGNOSIS — M4806 Spinal stenosis, lumbar region: Secondary | ICD-10-CM

## 2015-12-07 DIAGNOSIS — F1120 Opioid dependence, uncomplicated: Secondary | ICD-10-CM | POA: Diagnosis not present

## 2015-12-07 DIAGNOSIS — M545 Low back pain, unspecified: Secondary | ICD-10-CM

## 2015-12-07 DIAGNOSIS — M48061 Spinal stenosis, lumbar region without neurogenic claudication: Secondary | ICD-10-CM

## 2015-12-07 DIAGNOSIS — G309 Alzheimer's disease, unspecified: Secondary | ICD-10-CM

## 2015-12-07 MED ORDER — HYDROCODONE-ACETAMINOPHEN 10-325 MG PO TABS: 1.0000 | ORAL_TABLET | Freq: Four times a day (QID) | ORAL | 0 refills | Status: DC | PRN

## 2015-12-07 MED ORDER — FENTANYL 25 MCG/HR TD PT72: 25.0000 ug | MEDICATED_PATCH | TRANSDERMAL | 0 refills | Status: DC

## 2015-12-07 NOTE — Progress Notes (Signed)
Subjective:  Patient ID: Vicki Gomez, female    DOB: 06/13/1926  Age: 82 y.o. MRN: [id number]  CC: Back Pain (worsening, 3 mth rck); Hypertension; and alzheimer   HPI [name] presents for Follow-up of hypertension and also her back pain seems to be worsening she is moaning more. Her daughter was called to the residence where she lives alone because she had been found sitting slumped over and unable to right herself in a chair. She was awake and alert and free of trauma. She just could not lift herself back up to an upright position. The pain responded well to the fentanyl patch but caused a lot of swelling. They discontinued it but the daughter wants to try again now.    follow-up of hypertension. Patient has no history of headache chest pain or shortness of breath or recent cough. Patient also denies symptoms of TIA such as numbness weakness lateralizing. Patient checks  blood pressure at home and has not had any elevated readings recently. Patient denies side effects from his medication. States taking it regularly. Patient also continues to have inability to medicate adequately. She can communicate  pain through groaning.    History Rmani has a past medical history of Anxiety; Breast cancer ([place]) (1987); Chronic back pain; Hard of hearing; Hypertension; Spinal stenosis of lumbar region; and Vitamin D deficiency.   She has a past surgical history that includes Mastectomy (Right, 1987).   Her family history includes Stroke in her brother, father, and mother.She reports that she has quit smoking. She does not have any smokeless tobacco history on file. She reports that she does not drink alcohol or use drugs.    ROS Review of Systems  Unable to perform ROS: Dementia    Objective:  BP (!) 99/49 (BP Location: Left Arm, Patient Position: Sitting, Cuff Size: Small)   Pulse 68   Temp 97.7 F (36.5 C) (Oral)   Ht 5\' 2"  (1.575 m)   Wt 94 lb 12.8 oz (43 kg)   SpO2 99%   BMI  17.34 kg/m   BP Readings from Last 3 Encounters:  12/07/15 (!) 99/49  06/06/15 132/60  03/05/15 (!) 147/60    Wt Readings from Last 3 Encounters:  12/07/15 94 lb 12.8 oz (43 kg)  09/05/15 102 lb 12.8 oz (46.6 kg)  06/06/15 97 lb 3.2 oz (44.1 kg)     Physical Exam  Constitutional: She is oriented to person, place, and time. She appears well-developed and well-nourished. No distress.  Elderly, thin, frail, moaning  HENT:  Head: Normocephalic and atraumatic.  Eyes: Conjunctivae and EOM are normal. Pupils are equal, round, and reactive to light.  Neck: Normal range of motion. Neck supple. No JVD present. No thyromegaly present.  Cardiovascular: Normal rate, regular rhythm, S1 normal and S2 normal.   Murmur heard.  Systolic murmur is present with a grade of 3/6  bilasteral carotid bruit  Pulmonary/Chest: Effort normal and breath sounds normal. No respiratory distress. She has no wheezes. She has no rales.  Abdominal: Soft. Bowel sounds are normal. She exhibits no distension. There is no tenderness.  Musculoskeletal: Normal range of motion.  Lymphadenopathy:    She has no cervical adenopathy.  Neurological: She is alert and oriented to person, place, and time.  Skin: Skin is warm and dry. She is not diaphoretic.  Psychiatric: Her affect is blunt. Her speech is slurred. She is slowed. Cognition and memory are impaired. She expresses no suicidal plans and no homicidal plans. She  is noncommunicative. She is inattentive.       No results found.  Assessment & Plan:   [name] was seen today for back pain, hypertension and alzheimer.  Diagnoses and all orders for this visit:  Spinal stenosis of lumbar region -     fentaNYL (DURAGESIC - DOSED MCG/HR) 25 MCG/HR patch; Place 1 patch (25 mcg total) onto the skin every 3 (three) days. -     DG Lumbar Spine 2-3 Views; Future  Chronic low back pain -     fentaNYL (DURAGESIC - DOSED MCG/HR) 25 MCG/HR patch; Place 1 patch (25 mcg  total) onto the skin every 3 (three) days. -     DG Lumbar Spine 2-3 Views; Future  Alzheimer's dementia with behavioral disturbance, unspecified timing of dementia onset  Opiate dependence, continuous ([place])  Essential hypertension  Other orders -     HYDROcodone-acetaminophen (NORCO) 10-325 MG tablet; Take 1 tablet by mouth every 6 (six) hours as needed for moderate pain.   Detailed discussion of the need for the patient to be supervised 24 7. Admission to an assisted living facility is strongly recommended. We discussed the procedure contacting the nursing home, interviewing and touring, filling out paperwork including felt to followed by approvals and admission. She likely would need a memory care unit with some type of limitation for her wondering.  I have discontinued Ms. Moyers's Vitamin D (Ergocalciferol). I am also having her maintain her divalproex, raloxifene, DULoxetine, HYDROcodone-acetaminophen, HYDROcodone-acetaminophen, LORazepam, lisinopril-hydrochlorothiazide, metoprolol succinate, fentaNYL, and HYDROcodone-acetaminophen.  Meds ordered this encounter  Medications  . fentaNYL (DURAGESIC - DOSED MCG/HR) 25 MCG/HR patch    Sig: Place 1 patch (25 mcg total) onto the skin every 3 (three) days.    Dispense:  10 patch    Refill:  0  . HYDROcodone-acetaminophen (NORCO) 10-325 MG tablet    Sig: Take 1 tablet by mouth every 6 (six) hours as needed for moderate pain.    Dispense:  120 tablet    Refill:  0     Follow-up: Return in about 1 month (around 01/07/2016).  Claretta Fraise, M.D.

## 2015-12-26 ENCOUNTER — Other Ambulatory Visit: Payer: Self-pay

## 2015-12-26 DIAGNOSIS — I10 Essential (primary) hypertension: Secondary | ICD-10-CM

## 2015-12-26 MED ORDER — LISINOPRIL-HYDROCHLOROTHIAZIDE 20-12.5 MG PO TABS: 2.0000 | ORAL_TABLET | Freq: Every day | ORAL | 0 refills | Status: DC

## 2015-12-26 MED ORDER — METOPROLOL SUCCINATE ER 25 MG PO TB24: 25.0000 mg | ORAL_TABLET | Freq: Every day | ORAL | 0 refills | Status: DC

## 2015-12-27 ENCOUNTER — Other Ambulatory Visit: Payer: Self-pay

## 2015-12-27 MED ORDER — DULOXETINE HCL 20 MG PO CPEP: ORAL_CAPSULE | ORAL | 0 refills | Status: DC

## 2016-01-11 ENCOUNTER — Encounter: Payer: Self-pay | Admitting: Family Medicine

## 2016-01-11 ENCOUNTER — Ambulatory Visit (INDEPENDENT_AMBULATORY_CARE_PROVIDER_SITE_OTHER): Payer: Medicare PPO | Admitting: Family Medicine

## 2016-01-11 VITALS — BP 125/55 | HR 66 | Temp 97.1°F | Ht 62.0 in | Wt 93.0 lb

## 2016-01-11 DIAGNOSIS — G308 Other Alzheimer's disease: Secondary | ICD-10-CM

## 2016-01-11 DIAGNOSIS — M4806 Spinal stenosis, lumbar region: Secondary | ICD-10-CM

## 2016-01-11 DIAGNOSIS — F1120 Opioid dependence, uncomplicated: Secondary | ICD-10-CM | POA: Diagnosis not present

## 2016-01-11 DIAGNOSIS — G309 Alzheimer's disease, unspecified: Secondary | ICD-10-CM

## 2016-01-11 DIAGNOSIS — F0281 Dementia in other diseases classified elsewhere with behavioral disturbance: Secondary | ICD-10-CM | POA: Diagnosis not present

## 2016-01-11 DIAGNOSIS — G8929 Other chronic pain: Secondary | ICD-10-CM

## 2016-01-11 DIAGNOSIS — M545 Low back pain: Secondary | ICD-10-CM | POA: Diagnosis not present

## 2016-01-11 DIAGNOSIS — M48061 Spinal stenosis, lumbar region without neurogenic claudication: Secondary | ICD-10-CM

## 2016-01-11 MED ORDER — HYDROCODONE-ACETAMINOPHEN 10-325 MG PO TABS: 1.0000 | ORAL_TABLET | Freq: Four times a day (QID) | ORAL | 0 refills | Status: DC | PRN

## 2016-01-11 NOTE — Progress Notes (Signed)
Subjective:  Patient ID: Vicki Gomez, female    DOB: 03-Jul-1926  Age: 82 y.o. MRN: [id number]  CC: Back Pain (follow up - pain)   HPI Vicki Gomez presents for Patient complains of back pain constantly. She became confused with the use of the fentanyl patch. Although she is disoriented from her dementia it seemed that she was even more confused according to the daughter in attendance today who is her usual care taker. After discontinuing the patch the mental status returned to baseline very quickly. Also cause significant swelling of the ankles and legs. This resolved with discontinuing the patch. They have resumed the hydrocodone 4 times daily as well. Pain assessment: Cause of pain- Spinal stenosis Pain location- lumbar Pain on scale of 1-10- difficult to assess during dementia, family member states it is debilitating with regard to activity and moderately severe based on patient moaning complaining. Frequency-constant What increases pain-ambulation What makes pain Better-pain meds Effects on ADL - unable to perform due to dementia Any change in general medical condition-unable to tolerate the fentanyl due to increased confusion which has resolved  Current medications- hydrocodone 10 mg 4 times a day Effectiveness of current meds-adequate per caretaker Adverse reactions form pain meds-none  Pill count performed-No Urine drug screen- No Was the [place] reviewed- yes  If yes were their any concerning findings? - None   History Vicki Gomez has a past medical history of Anxiety; Breast cancer ([place]) (1987); Chronic back pain; Hard of hearing; Hypertension; Spinal stenosis of lumbar region; and Vitamin D deficiency.   She has a past surgical history that includes Mastectomy (Right, 1987).   Her family history includes Stroke in her brother, father, and mother.She reports that she has quit smoking. She has never used smokeless tobacco. She reports that she does not drink alcohol or use  drugs.    ROS Review of Systems  Unable to perform ROS: Dementia    Objective:  BP (!) 125/55 (BP Location: Right Arm)   Pulse 66   Temp 97.1 F (36.2 C) (Oral)   Ht 5\' 2"  (1.575 m)   Wt 93 lb (42.2 kg)   BMI 17.01 kg/m   BP Readings from Last 3 Encounters:  01/11/16 (!) 125/55  12/07/15 (!) 99/49  06/06/15 132/60    Wt Readings from Last 3 Encounters:  01/11/16 93 lb (42.2 kg)  12/07/15 94 lb 12.8 oz (43 kg)  09/05/15 102 lb 12.8 oz (46.6 kg)     Physical Exam  Constitutional: She appears well-nourished. No distress.  Neurological: She is alert. Coordination and gait abnormal.  Psychiatric: She is slowed. Cognition and memory are impaired. She is noncommunicative. She exhibits abnormal recent memory and abnormal remote memory.     Lab Results  Component Value Date   WBC 6.4 09/05/2015   HGB 14.3 03/24/2013   HCT 37.3 09/05/2015   PLT 238 09/05/2015   GLUCOSE 85 09/05/2015   CHOL 276 (H) 09/23/2012   TRIG 63 09/23/2012   HDL 91 09/23/2012   LDLCALC 172 (H) 09/23/2012   ALT 9 09/05/2015   AST 17 09/05/2015   NA 143 09/05/2015   K 4.5 09/05/2015   CL 99 09/05/2015   CREATININE 1.04 (H) 09/05/2015   BUN 23 09/05/2015   CO2 27 09/05/2015   TSH 1.132 09/23/2012   HGBA1C 6.0 09/23/2012    No results found.  Assessment & Plan:   Vicki Gomez was seen today for back pain.  Diagnoses and all orders for this visit:  Opiate dependence, continuous ([place])  Spinal stenosis of lumbar region  Alzheimer's dementia with behavioral disturbance, unspecified timing of dementia onset  Chronic low back pain  Other orders -     HYDROcodone-acetaminophen (NORCO) 10-325 MG tablet; Take 1 tablet by mouth every 6 (six) hours as needed. -     HYDROcodone-acetaminophen (NORCO) 10-325 MG tablet; Take 1 tablet by mouth every 6 (six) hours as needed. -     HYDROcodone-acetaminophen (NORCO) 10-325 MG tablet; Take 1 tablet by mouth every 6 (six) hours as needed for moderate  pain.      I have discontinued Vicki Gomez's fentaNYL. I am also having her maintain her divalproex, raloxifene, LORazepam, metoprolol succinate, lisinopril-hydrochlorothiazide, DULoxetine, HYDROcodone-acetaminophen, HYDROcodone-acetaminophen, and HYDROcodone-acetaminophen.  Meds ordered this encounter  Medications  . HYDROcodone-acetaminophen (NORCO) 10-325 MG tablet    Sig: Take 1 tablet by mouth every 6 (six) hours as needed.    Dispense:  120 tablet    Refill:  0    Do not fill until 02/10/2016  . HYDROcodone-acetaminophen (NORCO) 10-325 MG tablet    Sig: Take 1 tablet by mouth every 6 (six) hours as needed.    Dispense:  120 tablet    Refill:  0    Do not fill until 03/11/2016  . HYDROcodone-acetaminophen (NORCO) 10-325 MG tablet    Sig: Take 1 tablet by mouth every 6 (six) hours as needed for moderate pain.    Dispense:  120 tablet    Refill:  0     Follow-up: Return in about 3 months (around 04/11/2016).  Claretta Fraise, M.D.

## 2016-04-14 ENCOUNTER — Ambulatory Visit: Payer: Medicare PPO | Admitting: Family Medicine

## 2016-04-20 NOTE — Progress Notes (Signed)
Subjective:  Patient ID: Vicki Gomez, female    DOB: 1926-10-16  Age: 82 y.o. MRN: [id number]  CC: Hypertension (3 mo follow up ) and Dementia   HPI [name] presents for Patient complains of back pain constantly. Pain relief seems adequate according to grand daughter accompan. Although she is disoriented from her dementia it seemed that she  Has resumed baseline. Pill count now zero. Pain assessment: Cause of pain- Spinal stenosis Pain location- lumbar Pain on scale of 1-10- difficult to assess during dementia, family member states it is debilitating with regard to activity and moderately severe based on patient moaning complaining. Frequency-constant What increases pain-ambulation What makes pain Better-pain meds Effects on ADL - unable to perform due to dementia Any change in general medical condition-unable to tolerate the fentanyl due to increased confusion which has resolved  Current medications- hydrocodone 10 mg 4 times a day Effectiveness of current meds-adequate per caretaker Adverse reactions form pain meds-none  Pill count performed- yes = 0 Urine drug screen- No Was the [place] reviewed- no  History Vicki Gomez has a past medical history of Anxiety; Breast cancer ([place]) (1987); Chronic back pain; Hard of hearing; Hypertension; Spinal stenosis of lumbar region; and Vitamin D deficiency.   She has a past surgical history that includes Mastectomy (Right, 1987).   Her family history includes Stroke in her brother, father, and mother.She reports that she has quit smoking. She has never used smokeless tobacco. She reports that she does not drink alcohol or use drugs.    ROS Review of Systems  Unable to perform ROS: Dementia  HENT:       Grand daughter states left ear is impacted    Objective:  BP 133/64 (BP Location: Left Arm)   Pulse 65   Temp 98 F (36.7 C) (Oral)   Ht 5\' 2"  (1.575 m)   Wt 97 lb 12.8 oz (44.4 kg)   BMI 17.89 kg/m   BP Readings from Last  3 Encounters:  04/21/16 133/64  01/11/16 (!) 125/55  12/07/15 (!) 99/49    Wt Readings from Last 3 Encounters:  04/21/16 97 lb 12.8 oz (44.4 kg)  01/11/16 93 lb (42.2 kg)  12/07/15 94 lb 12.8 oz (43 kg)     Physical Exam  Constitutional: She is oriented to person, place, and time. She appears well-developed and well-nourished. No distress.  HENT:  Head: Normocephalic and atraumatic.  Right Ear: Tympanic membrane and ear canal normal.  Impacted cerumen AS removed with   Eyes: Conjunctivae are normal. Pupils are equal, round, and reactive to light.  Neck: Normal range of motion. Neck supple. No thyromegaly present.  Cardiovascular: Normal rate, regular rhythm and normal heart sounds.   No murmur heard. Pulmonary/Chest: Effort normal and breath sounds normal. No respiratory distress. She has no wheezes. She has no rales.  Abdominal: Soft. Bowel sounds are normal. She exhibits no distension. There is no tenderness.  Musculoskeletal: Normal range of motion.  Lymphadenopathy:    She has no cervical adenopathy.  Neurological: She is alert and oriented to person, place, and time. Coordination and gait abnormal.  Skin: Skin is warm and dry.  Psychiatric: She has a normal mood and affect. Judgment and thought content normal. She is slowed. Cognition and memory are impaired. She is noncommunicative. She exhibits abnormal recent memory and abnormal remote memory.     Lab Results  Component Value Date   WBC 6.4 09/05/2015   HGB 14.3 03/24/2013   HCT 37.3 09/05/2015  PLT 238 09/05/2015   GLUCOSE 85 09/05/2015   CHOL 276 (H) 09/23/2012   TRIG 63 09/23/2012   HDL 91 09/23/2012   LDLCALC 172 (H) 09/23/2012   ALT 9 09/05/2015   AST 17 09/05/2015   NA 143 09/05/2015   K 4.5 09/05/2015   CL 99 09/05/2015   CREATININE 1.04 (H) 09/05/2015   BUN 23 09/05/2015   CO2 27 09/05/2015   TSH 1.132 09/23/2012   HGBA1C 6.0 09/23/2012    No results found.  Assessment & Plan:   Vi  was seen today for hypertension and dementia.  Diagnoses and all orders for this visit:  Chronic bilateral low back pain, with sciatica presence unspecified  Opiate dependence, continuous (H[facility])  Essential hypertension  Spinal stenosis of lumbar region, unspecified whether neurogenic claudication present  Alzheimer's dementia with behavioral disturbance, unspecified timing of dementia onset  Other orders -     HYDROcodone-acetaminophen (NORCO) 10-325 MG tablet; Take 1 tablet by mouth every 6 (six) hours as needed. -     HYDROcodone-acetaminophen (NORCO) 10-325 MG tablet; Take 1 tablet by mouth every 6 (six) hours as needed. -     HYDROcodone-acetaminophen (NORCO) 10-325 MG tablet; Take 1 tablet by mouth every 6 (six) hours as needed for moderate pain.      I am having Ms. Mcnellis maintain her divalproex, raloxifene, LORazepam, metoprolol succinate, lisinopril-hydrochlorothiazide, DULoxetine, HYDROcodone-acetaminophen, HYDROcodone-acetaminophen, and HYDROcodone-acetaminophen.  Meds ordered this encounter  Medications  . HYDROcodone-acetaminophen (NORCO) 10-325 MG tablet    Sig: Take 1 tablet by mouth every 6 (six) hours as needed.    Dispense:  120 tablet    Refill:  0    Do not fill until 05/21/2016  . HYDROcodone-acetaminophen (NORCO) 10-325 MG tablet    Sig: Take 1 tablet by mouth every 6 (six) hours as needed.    Dispense:  120 tablet    Refill:  0    Do not fill until 06/21/2015  . HYDROcodone-acetaminophen (NORCO) 10-325 MG tablet    Sig: Take 1 tablet by mouth every 6 (six) hours as needed for moderate pain.    Dispense:  120 tablet    Refill:  0     Follow-up: No Follow-up on file.  Claretta Fraise, M.D.

## 2016-04-21 ENCOUNTER — Other Ambulatory Visit: Payer: Self-pay | Admitting: Family Medicine

## 2016-04-21 ENCOUNTER — Ambulatory Visit (INDEPENDENT_AMBULATORY_CARE_PROVIDER_SITE_OTHER): Payer: Medicare PPO | Admitting: Family Medicine

## 2016-04-21 ENCOUNTER — Other Ambulatory Visit: Payer: Self-pay | Admitting: Diagnostic Neuroimaging

## 2016-04-21 ENCOUNTER — Encounter: Payer: Self-pay | Admitting: Family Medicine

## 2016-04-21 VITALS — BP 133/64 | HR 65 | Temp 98.0°F | Ht 62.0 in | Wt 97.8 lb

## 2016-04-21 DIAGNOSIS — F0281 Dementia in other diseases classified elsewhere with behavioral disturbance: Secondary | ICD-10-CM

## 2016-04-21 DIAGNOSIS — Z23 Encounter for immunization: Secondary | ICD-10-CM | POA: Diagnosis not present

## 2016-04-21 DIAGNOSIS — F1120 Opioid dependence, uncomplicated: Secondary | ICD-10-CM | POA: Diagnosis not present

## 2016-04-21 DIAGNOSIS — I10 Essential (primary) hypertension: Secondary | ICD-10-CM | POA: Diagnosis not present

## 2016-04-21 DIAGNOSIS — G309 Alzheimer's disease, unspecified: Secondary | ICD-10-CM

## 2016-04-21 DIAGNOSIS — G308 Other Alzheimer's disease: Secondary | ICD-10-CM | POA: Diagnosis not present

## 2016-04-21 DIAGNOSIS — M545 Low back pain: Secondary | ICD-10-CM

## 2016-04-21 DIAGNOSIS — M48061 Spinal stenosis, lumbar region without neurogenic claudication: Secondary | ICD-10-CM | POA: Diagnosis not present

## 2016-04-21 DIAGNOSIS — G8929 Other chronic pain: Secondary | ICD-10-CM | POA: Diagnosis not present

## 2016-04-21 MED ORDER — HYDROCODONE-ACETAMINOPHEN 10-325 MG PO TABS: 1.0000 | ORAL_TABLET | Freq: Four times a day (QID) | ORAL | 0 refills | Status: DC | PRN

## 2016-05-31 ENCOUNTER — Other Ambulatory Visit: Payer: Self-pay | Admitting: Diagnostic Neuroimaging

## 2016-06-02 ENCOUNTER — Other Ambulatory Visit: Payer: Self-pay | Admitting: Family Medicine

## 2016-07-16 ENCOUNTER — Other Ambulatory Visit: Payer: Self-pay | Admitting: Family Medicine

## 2016-07-21 ENCOUNTER — Ambulatory Visit: Payer: Medicare PPO | Admitting: Family Medicine

## 2016-07-30 ENCOUNTER — Other Ambulatory Visit: Payer: Self-pay | Admitting: Family Medicine

## 2016-08-12 ENCOUNTER — Other Ambulatory Visit: Payer: Self-pay | Admitting: Family Medicine

## 2016-10-03 ENCOUNTER — Encounter: Payer: Self-pay | Admitting: Family Medicine

## 2016-10-03 ENCOUNTER — Ambulatory Visit (INDEPENDENT_AMBULATORY_CARE_PROVIDER_SITE_OTHER): Payer: Medicare PPO | Admitting: Family Medicine

## 2016-10-03 VITALS — BP 90/48 | HR 59 | Temp 98.2°F | Ht 62.0 in | Wt 91.0 lb

## 2016-10-03 DIAGNOSIS — G309 Alzheimer's disease, unspecified: Secondary | ICD-10-CM | POA: Diagnosis not present

## 2016-10-03 DIAGNOSIS — I10 Essential (primary) hypertension: Secondary | ICD-10-CM | POA: Diagnosis not present

## 2016-10-03 DIAGNOSIS — M545 Low back pain: Secondary | ICD-10-CM

## 2016-10-03 DIAGNOSIS — F0281 Dementia in other diseases classified elsewhere with behavioral disturbance: Secondary | ICD-10-CM

## 2016-10-03 DIAGNOSIS — M48061 Spinal stenosis, lumbar region without neurogenic claudication: Secondary | ICD-10-CM | POA: Diagnosis not present

## 2016-10-03 DIAGNOSIS — F1120 Opioid dependence, uncomplicated: Secondary | ICD-10-CM | POA: Diagnosis not present

## 2016-10-03 DIAGNOSIS — G8929 Other chronic pain: Secondary | ICD-10-CM | POA: Diagnosis not present

## 2016-10-03 LAB — URINALYSIS
Bilirubin, UA: NEGATIVE
Glucose, UA: NEGATIVE
LEUKOCYTES UA: NEGATIVE
NITRITE UA: NEGATIVE
RBC, UA: NEGATIVE
Specific Gravity, UA: >1.03 — ABNORMAL HIGH (ref 1.005–1.030)
Urobilinogen, Ur: 1 mg/dL (ref 0.2–1.0)
pH, UA: 5.5 (ref 5.0–7.5)

## 2016-10-03 MED ORDER — HYDROCODONE-ACETAMINOPHEN 10-325 MG PO TABS: 1.0000 | ORAL_TABLET | Freq: Two times a day (BID) | ORAL | 0 refills | Status: AC

## 2016-10-03 NOTE — Progress Notes (Signed)
Subjective:  Patient ID: Vicki Gomez, female    DOB: 04-16-1927  Age: 83 y.o. MRN: [id number]  CC: Hypertension (pt here today with her daughter for routine follow up of her chronic medical conditions and refills on her pain medications as well as wanting to discuss placement in a Nursing Facility)   HPI Vicki Gomez presents for Follow-up of chronic pain and dementia. Run in by caretaker who gives the history. Patient does say that she feels fine. Caretaker says that she is now taking the hydrocodone twice daily. Review of the [facility] S[facility] shows that the last prescription filled was approximately 4 months ago. This was for the hydrocodone 02/12/2024 one 4 times a day as needed #120. Apparently that has lasted this entire time. No explanation given of the gap. Caretaker says that she does complain of pain routinely and they put the medicine in her medication dispenser.   History Vicki Gomez has a past medical history of Anxiety; Breast cancer ([place]) (1987); Chronic back pain; Hard of hearing; Hypertension; Spinal stenosis of lumbar region; and Vitamin D deficiency.   She has a past surgical history that includes Mastectomy (Right, 1987).   Her family history includes Stroke in her brother, father, and mother.She reports that she has quit smoking. She has never used smokeless tobacco. She reports that she does not drink alcohol or use drugs.    ROS Review of Systems  Unable to perform ROS: Dementia    Objective:  BP (!) 90/48   Pulse (!) 59   Temp 98.2 F (36.8 C) (Oral)   Ht '5\' 2"'$  (1.575 m)   Wt 91 lb (41.3 kg)   BMI 16.64 kg/m   BP Readings from Last 3 Encounters:  10/03/16 (!) 90/48  04/21/16 133/64  01/11/16 (!) 125/55    Wt Readings from Last 3 Encounters:  10/03/16 91 lb (41.3 kg)  04/21/16 97 lb 12.8 oz (44.4 kg)  01/11/16 93 lb (42.2 kg)     Physical Exam  Constitutional: She appears well-developed and well-nourished. No distress.  HENT:  Head: Normocephalic and  atraumatic.  Eyes: EOM are normal. Pupils are equal, round, and reactive to light.  Neck: Normal range of motion. Neck supple. No thyromegaly present.  Cardiovascular: Normal rate and regular rhythm.   Murmur heard. Pulmonary/Chest: Effort normal and breath sounds normal.  Abdominal: Soft. Bowel sounds are normal. There is no tenderness.  Musculoskeletal: Normal range of motion.  Skin: Skin is warm and dry. No rash noted.  Psychiatric: Her mood appears not anxious. Her affect is not angry. Cognition and memory are impaired. She does not exhibit a depressed mood. She is noncommunicative (other than). She exhibits abnormal recent memory and abnormal remote memory.  Stating once that she feels fine.  Assessment & Plan:   Vicki Gomez was seen today for hypertension.  Diagnoses and all orders for this visit:  Essential hypertension -     CBC with Differential/Platelet -     CMP14+EGFR -     Urinalysis  Chronic bilateral low back pain, with sciatica presence unspecified -     ToxASSURE Select 13 (MW), Urine -     CBC with Differential/Platelet -     CMP14+EGFR -     Urinalysis  Opiate dependence, continuous (HCC) -     ToxASSURE Select 13 (MW), Urine -     CBC with Differential/Platelet -     CMP14+EGFR  Alzheimer's dementia with behavioral disturbance, unspecified timing of dementia onset -  ToxASSURE Select 13 (MW), Urine -     CBC with Differential/Platelet -     CMP14+EGFR  Spinal stenosis of lumbar region, unspecified whether neurogenic claudication present -     CBC with Differential/Platelet -     CMP14+EGFR  Other orders -     HYDROcodone-acetaminophen (NORCO) 10-325 MG tablet; Take 1 tablet by mouth 2 (two) times daily. -     HYDROcodone-acetaminophen (NORCO) 10-325 MG tablet; Take 1 tablet by mouth 2 (two) times daily. -     HYDROcodone-acetaminophen (NORCO) 10-325 MG tablet; Take 1 tablet by mouth 2 (two) times daily.       I have changed [name]'s  HYDROcodone-acetaminophen, HYDROcodone-acetaminophen, and HYDROcodone-acetaminophen. I am also having her maintain her LORazepam, metoprolol succinate, divalproex, raloxifene, DULoxetine, and lisinopril-hydrochlorothiazide.  Allergies as of 10/03/2016      Reactions   Codeine    Unsure of reaction   Fentanyl    Confusion and leg swelling   Pneumovax [pneumococcal Polysaccharide Vaccine]    Unsure of reaction      Medication List       Accurate as of 10/03/16  6:16 PM. Always use your most recent med list.          divalproex 250 MG DR tablet Commonly known as:  DEPAKOTE TAKE 1 TABLET (250 MG TOTAL) BY MOUTH 2 (TWO) TIMES DAILY.   DULoxetine 20 MG capsule Commonly known as:  CYMBALTA TAKE 1 CAPSULE (20 MG TOTAL) BY MOUTH DAILY.   HYDROcodone-acetaminophen 10-325 MG tablet Commonly known as:  NORCO Take 1 tablet by mouth 2 (two) times daily.   HYDROcodone-acetaminophen 10-325 MG tablet Commonly known as:  NORCO Take 1 tablet by mouth 2 (two) times daily.   HYDROcodone-acetaminophen 10-325 MG tablet Commonly known as:  NORCO Take 1 tablet by mouth 2 (two) times daily.   lisinopril-hydrochlorothiazide 20-12.5 MG tablet Commonly known as:  PRINZIDE,ZESTORETIC TAKE 2 TABLETS BY MOUTH DAILY.   LORazepam 0.5 MG tablet Commonly known as:  ATIVAN TAKE 1 TABLET EVERY 12 TO 24 HOURS AS NEEDED   metoprolol succinate 25 MG 24 hr tablet Commonly known as:  TOPROL-XL TAKE 1 TABLET (25 MG TOTAL) BY MOUTH DAILY.   raloxifene 60 MG tablet Commonly known as:  EVISTA TAKE 1 TABLET (60 MG TOTAL) BY MOUTH DAILY.        Follow-up: Return in about 3 months (around 01/03/2017).  Claretta Fraise, M.D.

## 2016-10-04 LAB — CBC WITH DIFFERENTIAL/PLATELET
BASOS ABS: 0 10*3/uL (ref 0.0–0.2)
BASOS: 1 %
EOS (ABSOLUTE): 0.1 10*3/uL (ref 0.0–0.4)
Eos: 1 %
HEMOGLOBIN: 11.6 g/dL (ref 11.1–15.9)
Hematocrit: 35.2 % (ref 34.0–46.6)
IMMATURE GRANS (ABS): 0 10*3/uL (ref 0.0–0.1)
IMMATURE GRANULOCYTES: 0 %
LYMPHS: 40 %
Lymphocytes Absolute: 2.2 10*3/uL (ref 0.7–3.1)
MCH: 29.9 pg (ref 26.6–33.0)
MCHC: 33 g/dL (ref 31.5–35.7)
MCV: 91 fL (ref 79–97)
Monocytes Absolute: 0.4 10*3/uL (ref 0.1–0.9)
Monocytes: 7 %
NEUTROS ABS: 2.9 10*3/uL (ref 1.4–7.0)
NEUTROS PCT: 51 %
Platelets: 279 10*3/uL (ref 150–379)
RBC: 3.88 x10E6/uL (ref 3.77–5.28)
RDW: 14.4 % (ref 12.3–15.4)
WBC: 5.6 10*3/uL (ref 3.4–10.8)

## 2016-10-04 LAB — CMP14+EGFR
ALBUMIN: 3.7 g/dL (ref 3.5–4.7)
ALT: 9 IU/L (ref 0–32)
AST: 16 IU/L (ref 0–40)
Albumin/Globulin Ratio: 1.2 (ref 1.2–2.2)
Alkaline Phosphatase: 67 IU/L (ref 39–117)
BUN / CREAT RATIO: 27 (ref 12–28)
BUN: 35 mg/dL — ABNORMAL HIGH (ref 8–27)
Bilirubin Total: 0.3 mg/dL (ref 0.0–1.2)
CALCIUM: 9 mg/dL (ref 8.7–10.3)
CHLORIDE: 100 mmol/L (ref 96–106)
CO2: 27 mmol/L (ref 18–29)
CREATININE: 1.31 mg/dL — AB (ref 0.57–1.00)
GFR, EST AFRICAN AMERICAN: 42 mL/min/{1.73_m2} — AB (ref >59)
GFR, EST NON AFRICAN AMERICAN: 37 mL/min/{1.73_m2} — AB (ref >59)
Globulin, Total: 3.2 g/dL (ref 1.5–4.5)
Glucose: 138 mg/dL — ABNORMAL HIGH (ref 65–99)
Potassium: 4.3 mmol/L (ref 3.5–5.2)
Sodium: 142 mmol/L (ref 134–144)
TOTAL PROTEIN: 6.9 g/dL (ref 6.0–8.5)

## 2016-10-08 LAB — TOXASSURE SELECT 13 (MW), URINE

## 2016-10-09 ENCOUNTER — Telehealth: Payer: Self-pay | Admitting: Family Medicine

## 2016-10-09 NOTE — Telephone Encounter (Signed)
Spoke with Lattie Haw and advised the rx is up front to be picked up.

## 2016-10-10 ENCOUNTER — Other Ambulatory Visit: Payer: Self-pay | Admitting: Family Medicine

## 2016-10-28 ENCOUNTER — Telehealth: Payer: Self-pay | Admitting: *Deleted

## 2016-10-28 NOTE — Telephone Encounter (Signed)
Vicki Gomez with Adult protective Services called requesting FL-2 for pt Spoke with [name] on 10/27/2016 Please call Vicki Gomez at [phone]  Ext [phone] Pt needs FL-2 for placement at [facility]

## 2016-10-29 NOTE — Telephone Encounter (Signed)
Spoke with Vicki Gomez and advised form was received and will be ready tomorrow by 10.

## 2016-11-07 ENCOUNTER — Telehealth: Payer: Self-pay | Admitting: Family Medicine

## 2016-11-07 ENCOUNTER — Other Ambulatory Visit: Payer: Self-pay | Admitting: Family Medicine

## 2016-11-07 NOTE — Telephone Encounter (Signed)
Attempted to call Larene Beach back and was sent to VM. Left detailed message with Gracy Ehly allergies and to call back with any further questions or concerns.

## 2016-11-10 ENCOUNTER — Emergency Department (HOSPITAL_COMMUNITY)
Admission: EM | Admit: 2016-11-10 | Discharge: 2016-11-10 | Disposition: A | Discharge status: Home / Self Care | Payer: Medicare PPO | Attending: Emergency Medicine | Admitting: Emergency Medicine

## 2016-11-10 ENCOUNTER — Encounter (HOSPITAL_COMMUNITY): Payer: Self-pay

## 2016-11-10 DIAGNOSIS — S0990XA Unspecified injury of head, initial encounter: Secondary | ICD-10-CM | POA: Diagnosis present

## 2016-11-10 DIAGNOSIS — Z79891 Long term (current) use of opiate analgesic: Secondary | ICD-10-CM | POA: Diagnosis not present

## 2016-11-10 DIAGNOSIS — Y92129 Unspecified place in nursing home as the place of occurrence of the external cause: Secondary | ICD-10-CM | POA: Diagnosis not present

## 2016-11-10 DIAGNOSIS — I10 Essential (primary) hypertension: Secondary | ICD-10-CM | POA: Diagnosis not present

## 2016-11-10 DIAGNOSIS — Z79899 Other long term (current) drug therapy: Secondary | ICD-10-CM | POA: Insufficient documentation

## 2016-11-10 DIAGNOSIS — Z87891 Personal history of nicotine dependence: Secondary | ICD-10-CM | POA: Insufficient documentation

## 2016-11-10 DIAGNOSIS — Y999 Unspecified external cause status: Secondary | ICD-10-CM | POA: Diagnosis not present

## 2016-11-10 DIAGNOSIS — S0181XA Laceration without foreign body of other part of head, initial encounter: Secondary | ICD-10-CM | POA: Insufficient documentation

## 2016-11-10 DIAGNOSIS — Y939 Activity, unspecified: Secondary | ICD-10-CM | POA: Diagnosis not present

## 2016-11-10 DIAGNOSIS — Z23 Encounter for immunization: Secondary | ICD-10-CM | POA: Diagnosis not present

## 2016-11-10 DIAGNOSIS — W07XXXA Fall from chair, initial encounter: Secondary | ICD-10-CM | POA: Diagnosis not present

## 2016-11-10 DIAGNOSIS — G8929 Other chronic pain: Secondary | ICD-10-CM | POA: Diagnosis not present

## 2016-11-10 DIAGNOSIS — W19XXXA Unspecified fall, initial encounter: Secondary | ICD-10-CM

## 2016-11-10 DIAGNOSIS — Z79810 Long term (current) use of selective estrogen receptor modulators (SERMs): Secondary | ICD-10-CM | POA: Insufficient documentation

## 2016-11-10 MED ORDER — POVIDONE-IODINE 10 % EX SOLN
CUTANEOUS | Status: AC
  Administered 2016-11-10: 08:00:00
  Filled 2016-11-10: qty 118

## 2016-11-10 MED ORDER — BACITRACIN ZINC 500 UNIT/GM EX OINT
TOPICAL_OINTMENT | CUTANEOUS | Status: AC
  Administered 2016-11-10: 08:00:00
  Filled 2016-11-10: qty 0.9

## 2016-11-10 MED ORDER — TETANUS-DIPHTH-ACELL PERTUSSIS 5-2.5-18.5 LF-MCG/0.5 IM SUSP
0.5000 mL | Freq: Once | INTRAMUSCULAR | Status: AC
  Administered 2016-11-10: 0.5 mL via INTRAMUSCULAR
  Filled 2016-11-10: qty 0.5

## 2016-11-10 MED ORDER — LIDOCAINE-EPINEPHRINE (PF) 2 %-1:200000 IJ SOLN
10.0000 mL | Freq: Once | INTRAMUSCULAR | Status: AC
  Administered 2016-11-10: 10 mL via INTRADERMAL
  Filled 2016-11-10: qty 20

## 2016-11-10 NOTE — ED Notes (Signed)
Dressing applied to left forehead. Skin tear to left lower leg cleaned with NS and bacitracin applied with dry dressing

## 2016-11-10 NOTE — ED Triage Notes (Signed)
PT brought in by [facility] for fall at [facility] in the Alzheimer's unit at 9pm last night. PT obtained laceration to left sided forehead and has dressing applied on arrival to ED. PT stays in a geri chair at the nursing home. PT alert and confused to her normal this am.

## 2016-11-10 NOTE — Discharge Instructions (Signed)
Tylenol or motrin for pain ER for worsening symtpoms.

## 2016-11-10 NOTE — ED Provider Notes (Signed)
[facility] DEPT Provider Note   CSN: [id number] Arrival date & time: 11/10/16  5366     History   Chief Complaint Chief Complaint  Patient presents with  . Fall    HPI Vicki Gomez is a 83 y.o. female.  HPI  83 y/o dementia - in an alzheimer's unit Had fall last night - unwitnessed Fell from [employer] - not sent last night for unknown reason despite laceration to the L forehead Has skin tear to the R tib - pt has no complaints of overnight including vomiting or seizures Level 5 caveat applies secondary to dementia  Past Medical History:  Diagnosis Date  . Anxiety   . Breast cancer ([place]) 1987  . Chronic back pain   . Hard of hearing   . Hypertension   . Spinal stenosis of lumbar region   . Vitamin D deficiency     Patient Active Problem List   Diagnosis Date Noted  . Essential hypertension 09/05/2015  . Alzheimer's disease 09/05/2015  . Vitamin D deficiency 03/05/2015  . Opiate dependence, continuous ([place]) 03/05/2015  . HX: breast cancer 08/10/2014  . Hard of hearing   . Spinal stenosis of lumbar region   . GAD (generalized anxiety disorder) 03/24/2013  . Hypokalemia 03/24/2013  . Chronic low back pain 03/24/2013    Past Surgical History:  Procedure Laterality Date  . MASTECTOMY Right 1987    OB History    No data available       Home Medications    Prior to Admission medications   Medication Sig Start Date End Date Taking? Authorizing Provider  divalproex (DEPAKOTE) 250 MG DR tablet TAKE 1 TABLET (250 MG TOTAL) BY MOUTH 2 (TWO) TIMES DAILY. 06/03/16   Claretta Fraise, MD  DULoxetine (CYMBALTA) 20 MG capsule TAKE 1 CAPSULE (20 MG TOTAL) BY MOUTH DAILY. 11/07/16   Claretta Fraise, MD  HYDROcodone-acetaminophen (NORCO) 10-325 MG tablet Take 1 tablet by mouth 2 (two) times daily. 10/03/16   Claretta Fraise, MD  HYDROcodone-acetaminophen (NORCO) 10-325 MG tablet Take 1 tablet by mouth 2 (two) times daily. 10/03/16   Claretta Fraise, MD    HYDROcodone-acetaminophen (NORCO) 10-325 MG tablet Take 1 tablet by mouth 2 (two) times daily. 10/03/16   Claretta Fraise, MD  lisinopril-hydrochlorothiazide (PRINZIDE,ZESTORETIC) 20-12.5 MG tablet TAKE 2 TABLETS BY MOUTH DAILY. 10/10/16   Claretta Fraise, MD  LORazepam (ATIVAN) 0.5 MG tablet TAKE 1 TABLET EVERY 12 TO 24 HOURS AS NEEDED 09/05/15   Claretta Fraise, MD  metoprolol succinate (TOPROL-XL) 25 MG 24 hr tablet TAKE 1 TABLET (25 MG TOTAL) BY MOUTH DAILY. 04/22/16   Claretta Fraise, MD  raloxifene (EVISTA) 60 MG tablet TAKE 1 TABLET (60 MG TOTAL) BY MOUTH DAILY. 07/16/16   Claretta Fraise, MD    Family History Family History  Problem Relation Age of Onset  . Stroke Mother   . Stroke Father        unsure  . Stroke Brother     Social History Social History  Substance Use Topics  . Smoking status: Former Research scientist (life sciences)  . Smokeless tobacco: Never Used     Comment: quit years ago  . Alcohol use No     Allergies   Codeine; Fentanyl; and Pneumovax [pneumococcal polysaccharide vaccine]   Review of Systems Review of Systems  Unable to perform ROS: Dementia     Physical Exam Updated Vital Signs BP (!) 143/61 (BP Location: Right Arm)   Pulse 82   Temp 97.9 F (36.6 C) (Oral)  Resp 18   Wt 41.9 kg (92 lb 6 oz)   SpO2 93%   BMI 16.90 kg/m   Physical Exam  Constitutional: She appears well-developed and well-nourished. No distress.  HENT:  Head: Normocephalic.  2.5 cm laceration to the L forehead  Eyes: Conjunctivae are normal. No scleral icterus.  Cardiovascular: Normal rate and regular rhythm.   Pulmonary/Chest: Effort normal and breath sounds normal.  Musculoskeletal: Normal range of motion. She exhibits tenderness ( ttp over the laceration site ). She exhibits no edema.  Neurological: She is alert. Coordination normal.  Sensation and motor intact - pt is happily demented - follows commands, speaks, disoriented at baseline  Skin: Skin is warm and dry. She is not diaphoretic.   Laceration located on forehead The Laceration is linear shaped The depth is sub Q - not through [place] The length is 2.5 cm     ED Treatments / Results  Labs (all labs ordered are listed, but only abnormal results are displayed) Labs Reviewed - No data to display   Radiology No results found.  Procedures Procedures (including critical care time)  Medications Ordered in ED Medications  lidocaine-EPINEPHrine (XYLOCAINE W/EPI) 2 %-1:200000 (PF) injection 10 mL (not administered)  povidone-iodine (BETADINE) 10 % external solution (not administered)  bacitracin 500 UNIT/GM ointment (not administered)     Initial Impression / Assessment and Plan / ED Course  I have reviewed the triage vital signs and the nursing notes.  Pertinent labs & imaging results that were available during my care of the patient were reviewed by me and considered in my medical decision making (see chart for details).    Lac, to repair Low risk head injury No surrounding hematoma No obvious neuro defecits Will avoid imaging  LACERATION REPAIR Performed by: Johnna Acosta Authorized by: Johnna Acosta Consent: Verbal consent obtained. Risks and benefits: risks, benefits and alternatives were discussed Consent given by: patient Patient identity confirmed: provided demographic data Prepped and Draped in normal sterile fashion Wound explored  Laceration Location: Vicki Gomez  Laceration Length: 2.5 cm  No Foreign Bodies seen or palpated  Anesthesia: local infiltration  Local anesthetic: lidocaine 1% with epinephrine  Anesthetic total: 3 ml  Irrigation method: syringe Amount of cleaning: standard  Skin closure: 6-0 prolene  Number of sutures: 4  Technique: Simple interrupted  Patient tolerance: Patient tolerated the procedure well with no immediate complications.   Final Clinical Impressions(s) / ED Diagnoses   Final diagnoses:  Fall, initial encounter  Forehead laceration, initial  encounter    New Prescriptions New Prescriptions   No medications on file     [name], MD 11/10/16 [phone]

## 2017-01-05 ENCOUNTER — Ambulatory Visit: Payer: Medicare PPO | Admitting: Family Medicine

## 2017-01-19 ENCOUNTER — Other Ambulatory Visit: Payer: Self-pay

## 2017-01-19 DIAGNOSIS — M7989 Other specified soft tissue disorders: Secondary | ICD-10-CM

## 2017-01-23 IMAGING — DX DG LUMBAR SPINE 2-3V
2 series · 2 of 2 positions shown · non-contrast
Comparison: Lumbar spine series of December 15, 2014

CLINICAL DATA: Known spinal stenosis, patient has had an increase
in severity of her chronic low back pain.

EXAM:
LUMBAR SPINE - 2-3 VIEW

[l-spine ap]
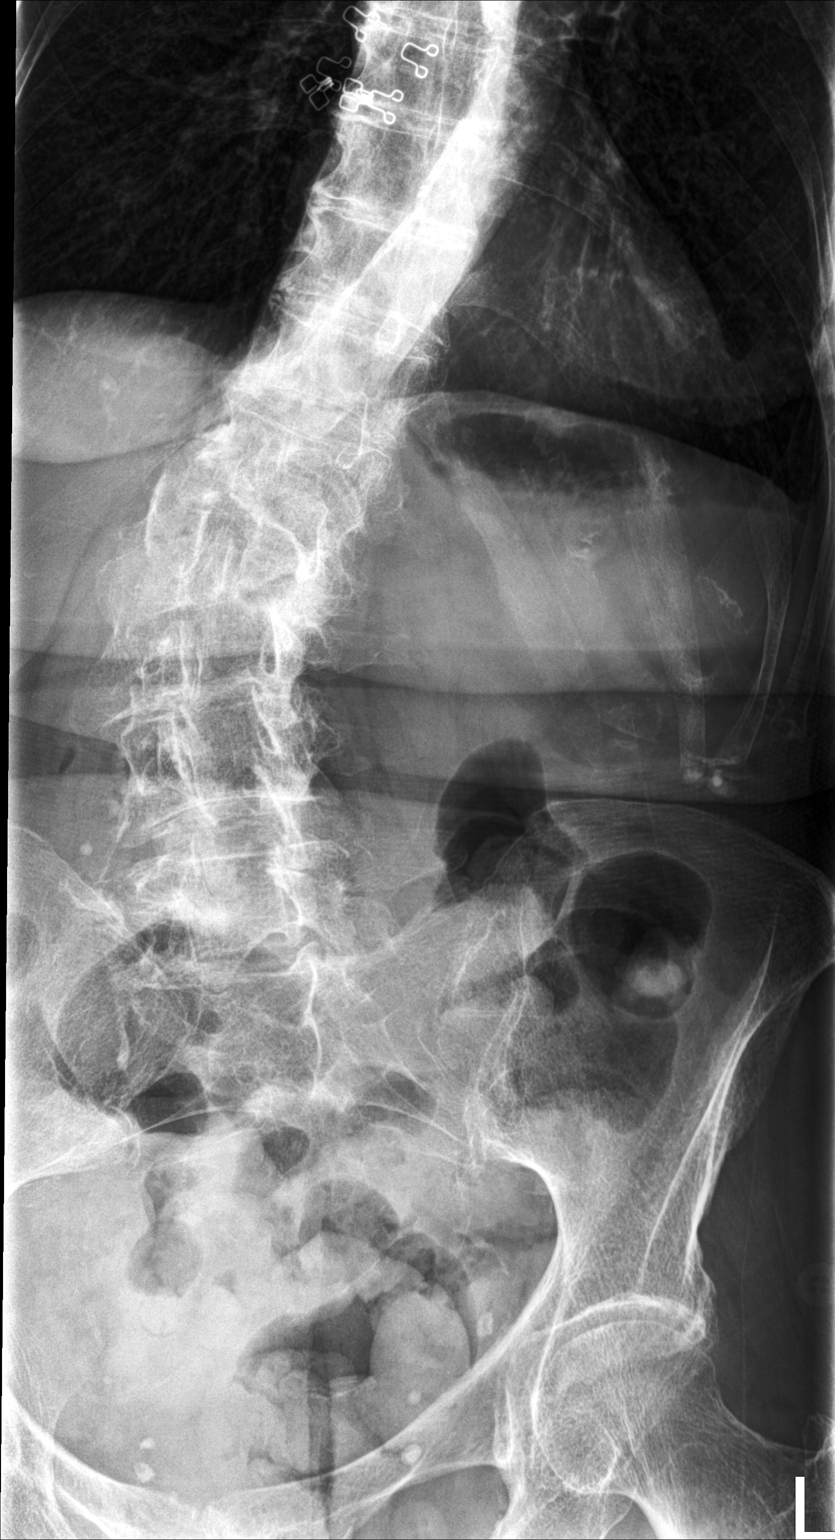

[l-spine lat]
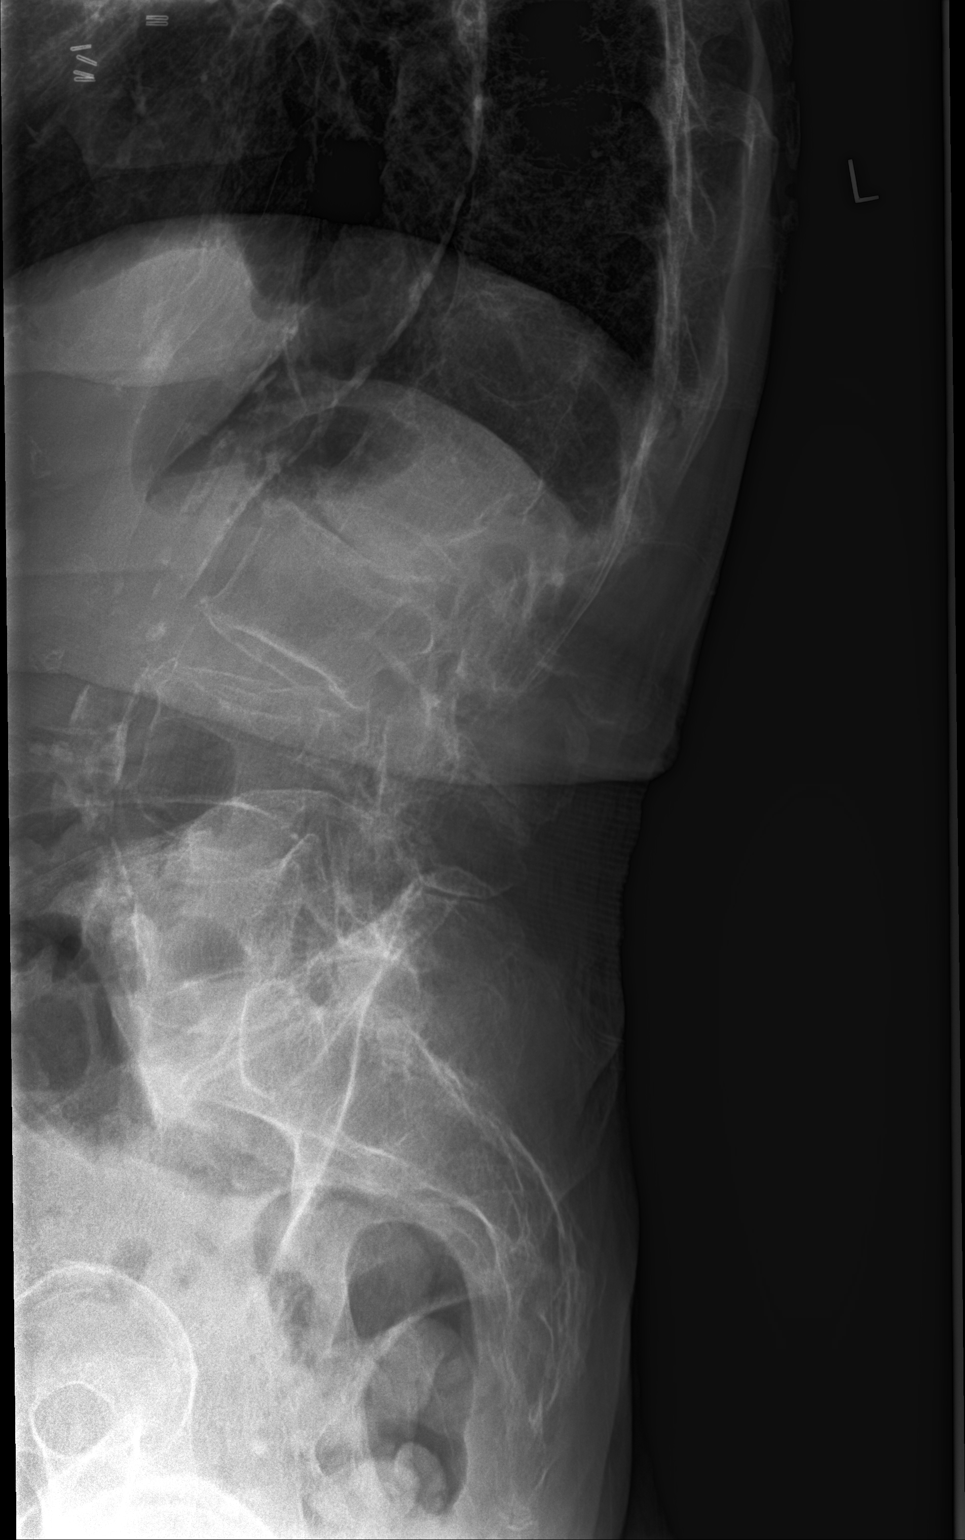

[2 of 2 positions shown; findings below may reference images not displayed]

FINDINGS: The lumbar vertebral bodies are diffusely osteopenic. There has been
further compression of the L1 compression fracture. L[DATE] be
partially compressed but is not well demonstrated due to the severe
osteopenia. L3 through L5 are preserved in height. No significant
facet joint abnormality is observed. There is stable grade 1
anterolisthesis of L3 with respect L2. There is moderate dextro
curvature centered at L2-3 which appears stable.
IMPRESSION: Severe osteopenia. Further compression of the body of L1 and likely
compression of the body of L2 since the previous study. For further
anatomic evaluation, lumbar spine CT scanning or MRI would be the
most useful next imaging step.

## 2017-02-19 ENCOUNTER — Encounter (HOSPITAL_COMMUNITY): Payer: Self-pay

## 2017-02-19 ENCOUNTER — Encounter: Payer: Medicare PPO | Admitting: Vascular Surgery

## 2017-03-30 ENCOUNTER — Encounter: Payer: Medicare PPO | Admitting: Surgery

## 2017-03-30 ENCOUNTER — Telehealth (HOSPITAL_COMMUNITY): Payer: Self-pay | Admitting: *Deleted

## 2017-03-30 ENCOUNTER — Ambulatory Visit (HOSPITAL_COMMUNITY)
Admission: RE | Admit: 2017-03-30 | Discharge: 2017-03-30 | Disposition: A | Discharge status: Home / Self Care | Payer: Medicare PPO | Source: Ambulatory Visit | Attending: Vascular Surgery | Admitting: Vascular Surgery

## 2017-03-30 DIAGNOSIS — M7989 Other specified soft tissue disorders: Secondary | ICD-10-CM

## 2017-03-30 NOTE — Telephone Encounter (Signed)
Patient screaming not to perform exam . There was no power of attorney to override her objections.

## 2017-05-20 ENCOUNTER — Encounter (HOSPITAL_COMMUNITY): Payer: Medicare PPO

## 2017-05-20 ENCOUNTER — Encounter: Payer: Medicare PPO | Admitting: Vascular Surgery

## 2017-06-26 ENCOUNTER — Ambulatory Visit (HOSPITAL_COMMUNITY)
Admission: RE | Admit: 2017-06-26 | Discharge: 2017-06-26 | Disposition: A | Discharge status: Home / Self Care | Payer: Medicare Other | Source: Ambulatory Visit | Attending: Vascular Surgery | Admitting: Vascular Surgery

## 2017-06-26 ENCOUNTER — Other Ambulatory Visit: Payer: Self-pay

## 2017-06-26 ENCOUNTER — Ambulatory Visit (INDEPENDENT_AMBULATORY_CARE_PROVIDER_SITE_OTHER): Payer: Medicare Other | Admitting: Vascular Surgery

## 2017-06-26 ENCOUNTER — Encounter: Payer: Self-pay | Admitting: Vascular Surgery

## 2017-06-26 VITALS — BP 158/67 | HR 63 | Temp 97.0°F | Resp 14 | Ht 62.0 in | Wt 114.0 lb

## 2017-06-26 DIAGNOSIS — I890 Lymphedema, not elsewhere classified: Secondary | ICD-10-CM | POA: Diagnosis not present

## 2017-06-26 DIAGNOSIS — M7989 Other specified soft tissue disorders: Secondary | ICD-10-CM

## 2017-06-26 DIAGNOSIS — I8391 Asymptomatic varicose veins of right lower extremity: Secondary | ICD-10-CM | POA: Diagnosis not present

## 2017-06-26 DIAGNOSIS — C50919 Malignant neoplasm of unspecified site of unspecified female breast: Secondary | ICD-10-CM | POA: Insufficient documentation

## 2017-06-26 NOTE — Progress Notes (Signed)
Patient ID: Vicki Gomez, female   DOB: 06-Jun-1926, 84 y.o.   MRN: [id number]  Reason for Consult: New Patient (Initial Visit) (bilateral lower extremity swelling)   Referred by Claretta Fraise, MD  Subjective:     HPI:  Vicki Gomez is a 84 y.o. female with a history of Alzheimer's currently is a resident of [facility].  She does not walk much at all.  She has had bilateral lower extremity swelling for which she is now sent for evaluation.  She does not take any blood thinners.  She had lower extremity reflux study prior to today's visit.  She is accompanied by her POA she is unable to provide answers herself.  Past Medical History:  Diagnosis Date  . Anxiety   . Breast cancer ([place]) 1987  . Chronic back pain   . Hard of hearing   . Hypertension   . Spinal stenosis of lumbar region   . Vitamin D deficiency    Family History  Problem Relation Age of Onset  . Stroke Mother   . Stroke Father        unsure  . Stroke Brother    Past Surgical History:  Procedure Laterality Date  . MASTECTOMY Right 1987    Short Social History:  Social History   Tobacco Use  . Smoking status: Former Research scientist (life sciences)  . Smokeless tobacco: Never Used  . Tobacco comment: quit years ago  Substance Use Topics  . Alcohol use: No    Allergies  Allergen Reactions  . Codeine     Unsure of reaction  . Fentanyl     Confusion and leg swelling  . Pneumovax [Pneumococcal Polysaccharide Vaccine]     Unsure of reaction    Current Outpatient Medications  Medication Sig Dispense Refill  . divalproex (DEPAKOTE) 250 MG DR tablet TAKE 1 TABLET (250 MG TOTAL) BY MOUTH 2 (TWO) TIMES DAILY. 60 tablet 0  . DULoxetine (CYMBALTA) 20 MG capsule TAKE 1 CAPSULE (20 MG TOTAL) BY MOUTH DAILY. 90 capsule 0  . HYDROcodone-acetaminophen (NORCO) 10-325 MG tablet Take 1 tablet by mouth 2 (two) times daily. 60 tablet 0  . HYDROcodone-acetaminophen (NORCO) 10-325 MG tablet Take 1 tablet by mouth 2 (two) times daily. 60  tablet 0  . HYDROcodone-acetaminophen (NORCO) 10-325 MG tablet Take 1 tablet by mouth 2 (two) times daily. 60 tablet 0  . lisinopril-hydrochlorothiazide (PRINZIDE,ZESTORETIC) 20-12.5 MG tablet TAKE 2 TABLETS BY MOUTH DAILY. 180 tablet 1  . LORazepam (ATIVAN) 0.5 MG tablet TAKE 1 TABLET EVERY 12 TO 24 HOURS AS NEEDED 60 tablet 5  . metoprolol succinate (TOPROL-XL) 25 MG 24 hr tablet TAKE 1 TABLET (25 MG TOTAL) BY MOUTH DAILY. 90 tablet 1  . raloxifene (EVISTA) 60 MG tablet TAKE 1 TABLET (60 MG TOTAL) BY MOUTH DAILY. 30 tablet 2   No current facility-administered medications for this visit.     REVIEW OF SYSTEMS  Unable to obtain from patient, daughter describes leg swelling with intermittent episodes of cellulitis    Objective:  Objective   Vitals:   06/26/17 1047  BP: (!) 170/65  Pulse: 65  Resp: 14  Temp: (!) 97 F (36.1 C)  TempSrc: Oral  SpO2: 98%  Weight: 114 lb (51.7 kg)  Height: 5\' 2"  (1.575 m)   Body mass index is 20.85 kg/m.  Physical Exam  Constitutional: She is oriented to person, place, and time. She appears well-developed.  HENT:  Head: Normocephalic.  Eyes: Pupils are equal, round, and reactive to light.  Neck: Normal range of motion.  Cardiovascular: Normal rate.  Pulses:      Femoral pulses are 2+ on the right side, and 2+ on the left side.      Popliteal pulses are 2+ on the right side, and 2+ on the left side.  Strong right dp signal, venous signals in left foot only but cap refill <2 s  Pulmonary/Chest: Effort normal.  Abdominal: Soft. She exhibits no mass.  Musculoskeletal: She exhibits no edema.  Neurological: She is alert and oriented to person, place, and time.  Skin:  Heaped skin on ble  Psychiatric: She has a normal mood and affect. Her behavior is normal. Judgment and thought content normal.    Data: I have independently interpreted her bilateral lower extremity reflux study which demonstrates saphenofemoral junction of the right 0.63 cm in  the left 0.56 cm.  Reflux times noted in the right great saphenous vein at the knee only.     Assessment/Plan:     84 year old female currently a nursing home resident with Alzheimer's disease and bilateral lower extremity swelling with occasional cellulitis.  She needs to have her legs cleaned and dried to prevent buildup of lotion and excess skin which she currently has.  She should also wear gentle compression and have her legs elevated when she is recumbent.  Her leg swelling is likely multifactorial with only minimal venous reflux today.  I would not offer her procedures given her current state and the fact that she has not had venous ulceration.  She can follow-up on a as needed basis.     Waynetta Sandy MD [facility]

## 2017-06-26 NOTE — Progress Notes (Signed)
Vitals:   06/26/17 1047  BP: (!) 170/65  Pulse: 65  Resp: 14  Temp: (!) 97 F (36.1 C)  TempSrc: Oral  SpO2: 98%  Weight: 114 lb (51.7 kg)  Height: 5\' 2"  (1.575 m)

## 2019-02-10 DEATH — deceased
